# Patient Record
Sex: Female | Born: 1980 | Hispanic: No | Marital: Single | Smoking: Current every day smoker
Health system: Southern US, Community
[De-identification: ages and names within clinical notes are randomized; demographics above are authoritative.]

## PROBLEM LIST (undated history)

## (undated) ENCOUNTER — Inpatient Hospital Stay (HOSPITAL_COMMUNITY): Payer: Self-pay

## (undated) DIAGNOSIS — Z789 Other specified health status: Secondary | ICD-10-CM

## (undated) DIAGNOSIS — F909 Attention-deficit hyperactivity disorder, unspecified type: Secondary | ICD-10-CM

## (undated) HISTORY — DX: Attention-deficit hyperactivity disorder, unspecified type: F90.9

## (undated) HISTORY — PX: NO PAST SURGERIES: SHX2092

---
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
## 2002-02-03 ENCOUNTER — Emergency Department (HOSPITAL_COMMUNITY): Admission: EM | Admit: 2002-02-03 | Discharge: 2002-02-03 | Payer: Self-pay

## 2002-02-03 ENCOUNTER — Encounter: Payer: Self-pay | Admitting: Emergency Medicine

## 2002-03-03 ENCOUNTER — Other Ambulatory Visit: Admission: RE | Admit: 2002-03-03 | Discharge: 2002-03-03 | Payer: Self-pay | Admitting: Obstetrics and Gynecology

## 2003-02-06 ENCOUNTER — Encounter: Payer: Self-pay | Admitting: Emergency Medicine

## 2003-02-06 ENCOUNTER — Emergency Department (HOSPITAL_COMMUNITY): Admission: EM | Admit: 2003-02-06 | Discharge: 2003-02-06 | Payer: Self-pay | Admitting: Emergency Medicine

## 2003-03-27 ENCOUNTER — Emergency Department (HOSPITAL_COMMUNITY): Admission: EM | Admit: 2003-03-27 | Discharge: 2003-03-27 | Payer: Self-pay | Admitting: Emergency Medicine

## 2003-03-27 ENCOUNTER — Encounter: Payer: Self-pay | Admitting: Emergency Medicine

## 2003-04-19 ENCOUNTER — Emergency Department (HOSPITAL_COMMUNITY): Admission: EM | Admit: 2003-04-19 | Discharge: 2003-04-19 | Payer: Self-pay | Admitting: Emergency Medicine

## 2003-04-20 ENCOUNTER — Emergency Department (HOSPITAL_COMMUNITY): Admission: EM | Admit: 2003-04-20 | Discharge: 2003-04-20 | Payer: Self-pay | Admitting: Emergency Medicine

## 2003-04-21 ENCOUNTER — Emergency Department (HOSPITAL_COMMUNITY): Admission: EM | Admit: 2003-04-21 | Discharge: 2003-04-21 | Payer: Self-pay | Admitting: Emergency Medicine

## 2003-04-21 ENCOUNTER — Encounter: Payer: Self-pay | Admitting: Emergency Medicine

## 2004-07-14 ENCOUNTER — Emergency Department (HOSPITAL_COMMUNITY): Admission: EM | Admit: 2004-07-14 | Discharge: 2004-07-14 | Payer: Self-pay | Admitting: Emergency Medicine

## 2004-11-06 ENCOUNTER — Emergency Department (HOSPITAL_COMMUNITY): Admission: EM | Admit: 2004-11-06 | Discharge: 2004-11-06 | Payer: Self-pay | Admitting: Emergency Medicine

## 2005-07-01 ENCOUNTER — Emergency Department (HOSPITAL_COMMUNITY): Admission: EM | Admit: 2005-07-01 | Discharge: 2005-07-01 | Payer: Self-pay | Admitting: Emergency Medicine

## 2005-08-02 ENCOUNTER — Emergency Department (HOSPITAL_COMMUNITY): Admission: EM | Admit: 2005-08-02 | Discharge: 2005-08-02 | Payer: Self-pay | Admitting: Emergency Medicine

## 2005-08-19 ENCOUNTER — Emergency Department (HOSPITAL_COMMUNITY): Admission: EM | Admit: 2005-08-19 | Discharge: 2005-08-20 | Payer: Self-pay | Admitting: Emergency Medicine

## 2005-10-20 ENCOUNTER — Emergency Department (HOSPITAL_COMMUNITY): Admission: EM | Admit: 2005-10-20 | Discharge: 2005-10-20 | Payer: Self-pay | Admitting: Emergency Medicine

## 2005-11-02 ENCOUNTER — Emergency Department (HOSPITAL_COMMUNITY): Admission: EM | Admit: 2005-11-02 | Discharge: 2005-11-02 | Payer: Self-pay | Admitting: Emergency Medicine

## 2006-03-25 ENCOUNTER — Emergency Department (HOSPITAL_COMMUNITY): Admission: EM | Admit: 2006-03-25 | Discharge: 2006-03-25 | Payer: Self-pay | Admitting: Emergency Medicine

## 2007-01-15 ENCOUNTER — Emergency Department (HOSPITAL_COMMUNITY): Admission: EM | Admit: 2007-01-15 | Discharge: 2007-01-15 | Payer: Self-pay | Admitting: Emergency Medicine

## 2007-04-28 ENCOUNTER — Emergency Department (HOSPITAL_COMMUNITY): Admission: EM | Admit: 2007-04-28 | Discharge: 2007-04-28 | Payer: Self-pay | Admitting: Emergency Medicine

## 2007-08-28 ENCOUNTER — Emergency Department (HOSPITAL_COMMUNITY): Admission: EM | Admit: 2007-08-28 | Discharge: 2007-08-28 | Payer: Self-pay | Admitting: Emergency Medicine

## 2009-11-06 ENCOUNTER — Emergency Department (HOSPITAL_COMMUNITY): Admission: EM | Admit: 2009-11-06 | Discharge: 2009-11-06 | Payer: Self-pay | Admitting: Emergency Medicine

## 2010-08-14 ENCOUNTER — Emergency Department (HOSPITAL_COMMUNITY): Admission: EM | Admit: 2010-08-14 | Discharge: 2010-08-14 | Payer: Self-pay | Admitting: Emergency Medicine

## 2012-09-29 ENCOUNTER — Emergency Department (HOSPITAL_COMMUNITY)
Admission: EM | Admit: 2012-09-29 | Discharge: 2012-09-29 | Disposition: A | Discharge status: Home / Self Care | Payer: Self-pay | Attending: Emergency Medicine | Admitting: Emergency Medicine

## 2012-09-29 ENCOUNTER — Emergency Department (HOSPITAL_COMMUNITY): Payer: Self-pay

## 2012-09-29 ENCOUNTER — Encounter (HOSPITAL_COMMUNITY): Payer: Self-pay | Admitting: Emergency Medicine

## 2012-09-29 DIAGNOSIS — J3489 Other specified disorders of nose and nasal sinuses: Secondary | ICD-10-CM | POA: Insufficient documentation

## 2012-09-29 DIAGNOSIS — J40 Bronchitis, not specified as acute or chronic: Secondary | ICD-10-CM | POA: Insufficient documentation

## 2012-09-29 DIAGNOSIS — F17200 Nicotine dependence, unspecified, uncomplicated: Secondary | ICD-10-CM | POA: Insufficient documentation

## 2012-09-29 DIAGNOSIS — J029 Acute pharyngitis, unspecified: Secondary | ICD-10-CM | POA: Insufficient documentation

## 2012-09-29 LAB — RAPID STREP SCREEN (MED CTR MEBANE ONLY): Streptococcus, Group A Screen (Direct): NEGATIVE

## 2012-09-29 MED ORDER — AZITHROMYCIN 250 MG PO TABS: 250.0000 mg | ORAL_TABLET | Freq: Every day | ORAL | Status: DC

## 2012-09-29 MED ORDER — ALBUTEROL SULFATE HFA 108 (90 BASE) MCG/ACT IN AERS: 2.0000 | INHALATION_SPRAY | RESPIRATORY_TRACT | Status: DC | PRN

## 2012-09-29 NOTE — ED Notes (Signed)
Pt reports head cold/cough x 1 month, states she has been taking dayquil and nyquil without helping.

## 2012-09-29 NOTE — ED Provider Notes (Signed)
History     CSN: [id number]  Arrival date & time 09/29/12  1310   First MD Initiated Contact with Patient 09/29/12 1424      Chief Complaint  Patient presents with  . Cough  . Nasal Congestion    (Consider location/radiation/quality/duration/timing/severity/associated sxs/prior treatment) HPI Comments: This is a 31 year old female, who reports having cough and nasal congestion for 2 months. Patient states that she works at [employer] in [employer], and is constantly exposed to the cold night air. She believes that this is worsened her cold. She's been trying DayQuil, NyQuil, and Sudafed with little relief. She states that her discomfort is moderate. She reports coughing up sputum in the morning. She denies chest pain, shortness of breath, nausea, vomiting, diarrhea, constipation, numbness or tingling to extremities. She endorses cough, nasal congestion, sore throat.  The history is provided by the patient. No language interpreter was used.    History reviewed. No pertinent past medical history.  History reviewed. No pertinent past surgical history.  No family history on file.  History  Substance Use Topics  . Smoking status: Current Every Day Smoker  . Smokeless tobacco: Not on file  . Alcohol Use: Yes    OB History    Grav Para Term Preterm Abortions TAB SAB Ect Mult Living                  Review of Systems  All other systems reviewed and are negative.    Allergies  Review of patient's allergies indicates no known allergies.  Home Medications   Current Outpatient Rx  Name  Route  Sig  Dispense  Refill  . PSEUDOEPHEDRINE HCL 30 MG PO TABS   Oral   Take 30 mg by mouth every 4 (four) hours as needed. Congestion         . PSEUDOEPHEDRINE-APAP-DM 11-914-78 MG/30ML PO LIQD   Oral   Take 10 mLs by mouth as needed. Cough           BP 112/80  Pulse 73  Temp 98.2 F (36.8 C) (Oral)  Resp 22  SpO2 100%  LMP 08/20/2012  Physical Exam  Nursing note  and vitals reviewed. Constitutional: She is oriented to person, place, and time. She appears well-developed and well-nourished.  HENT:  Head: Normocephalic and atraumatic.       Red swollen nasal turbinates, oropharynx is red but without exudate.  Eyes: Conjunctivae normal and EOM are normal. Pupils are equal, round, and reactive to light.  Neck: Normal range of motion. Neck supple.  Cardiovascular: Normal rate and regular rhythm.  Exam reveals no gallop and no friction rub.   No murmur heard. Pulmonary/Chest: Effort normal. No respiratory distress. She has wheezes. She has no rales. She exhibits no tenderness.       Diffuse wheezes  Abdominal: Soft. Bowel sounds are normal. She exhibits no distension and no mass. There is no tenderness. There is no rebound and no guarding.  Musculoskeletal: Normal range of motion. She exhibits no edema and no tenderness.  Neurological: She is alert and oriented to person, place, and time.  Skin: Skin is warm and dry.  Psychiatric: She has a normal mood and affect. Her behavior is normal. Judgment and thought content normal.    ED Course  Procedures (including critical care time)   Labs Reviewed  RAPID STREP SCREEN   Results for orders placed during the hospital encounter of 09/29/12  RAPID STREP SCREEN      Component Value Range  Streptococcus, Group A Screen (Direct) NEGATIVE  NEGATIVE   Dg Chest 2 View  09/29/2012  *RADIOLOGY REPORT*  Clinical Data: Cough, wheezing  CHEST - 2 VIEW  Comparison: 11/06/2004  Findings: Cardiomediastinal silhouette is unremarkable.  No acute infiltrate or pleural effusion.  No pulmonary edema.  Bony thorax is unremarkable.  IMPRESSION: No active disease.   Original Report Authenticated By: Natasha Mead, M.D.        1. Bronchitis       MDM  This is a 31 year old female with cold perhaps bronchitis. Patient has been sick for over a month. I'm going to give the patient an inhaler and Z-Pak based on her pulmonary  exam. Patient is agreeable with this. Patient is stable and ready for discharge.        Roxy Horseman, PA-C 09/29/12 1537

## 2012-09-29 NOTE — ED Notes (Signed)
Patient given discharge instructions, information, prescriptions, and diet order. Patient states that they adequately understand discharge information given and to return to ED if symptoms return or worsen.     

## 2012-10-01 NOTE — ED Provider Notes (Signed)
Medical screening examination/treatment/procedure(s) were performed by non-physician practitioner and as supervising physician I was immediately available for consultation/collaboration.    Nelia Shi, MD 10/01/12 [phone]

## 2012-10-06 ENCOUNTER — Encounter (HOSPITAL_COMMUNITY): Payer: Self-pay | Admitting: *Deleted

## 2012-10-06 ENCOUNTER — Emergency Department (HOSPITAL_COMMUNITY): Payer: Self-pay

## 2012-10-06 ENCOUNTER — Emergency Department (HOSPITAL_COMMUNITY)
Admission: EM | Admit: 2012-10-06 | Discharge: 2012-10-06 | Disposition: A | Discharge status: Home / Self Care | Payer: Self-pay | Attending: Emergency Medicine | Admitting: Emergency Medicine

## 2012-10-06 ENCOUNTER — Other Ambulatory Visit: Payer: Self-pay

## 2012-10-06 DIAGNOSIS — J40 Bronchitis, not specified as acute or chronic: Secondary | ICD-10-CM | POA: Insufficient documentation

## 2012-10-06 DIAGNOSIS — Z79899 Other long term (current) drug therapy: Secondary | ICD-10-CM | POA: Insufficient documentation

## 2012-10-06 DIAGNOSIS — F17200 Nicotine dependence, unspecified, uncomplicated: Secondary | ICD-10-CM | POA: Insufficient documentation

## 2012-10-06 DIAGNOSIS — F410 Panic disorder [episodic paroxysmal anxiety] without agoraphobia: Secondary | ICD-10-CM

## 2012-10-06 DIAGNOSIS — F411 Generalized anxiety disorder: Secondary | ICD-10-CM | POA: Insufficient documentation

## 2012-10-06 DIAGNOSIS — R0789 Other chest pain: Secondary | ICD-10-CM | POA: Insufficient documentation

## 2012-10-06 MED ORDER — ALBUTEROL SULFATE HFA 108 (90 BASE) MCG/ACT IN AERS
2.0000 | INHALATION_SPRAY | RESPIRATORY_TRACT | Status: DC | PRN
  Administered 2012-10-06: 2 via RESPIRATORY_TRACT
  Filled 2012-10-06: qty 6.7

## 2012-10-06 NOTE — ED Provider Notes (Signed)
History     CSN: [id number]  Arrival date & time 10/06/12  0200   First MD Initiated Contact with Patient 10/06/12 [phone]      Chief Complaint  Patient presents with  . breathing difficulties     (Consider location/radiation/quality/duration/timing/severity/associated sxs/prior treatment) HPI History provided by pt.  Poor historian because agitated and intoxicated.  Reports that she developed constant, non-radiating right-sided CP, described as her chest being ripped out, with associated dyspnea approx 45 minutes ago while getting into bed.  Believes she was having a panic attack; family history.  No associated palpitations, tremors, fever, abdominal pain.  Has been coughing for greater than one week.  Per prior chart, diagnosed w/ bronchitis on 09/29/12.  No h/o anxiety.  Nursing staff reports that patient was hyperventilating when she arrived in ED.   No past medical history on file.  No past surgical history on file.  No family history on file.  History  Substance Use Topics  . Smoking status: Current Every Day Smoker  . Smokeless tobacco: Not on file  . Alcohol Use: Yes    OB History    Grav Para Term Preterm Abortions TAB SAB Ect Mult Living                  Review of Systems  All other systems reviewed and are negative.    Allergies  Review of patient's allergies indicates no known allergies.  Home Medications   Current Outpatient Rx  Name  Route  Sig  Dispense  Refill  . ALBUTEROL SULFATE HFA 108 (90 BASE) MCG/ACT IN AERS   Inhalation   Inhale 2 puffs into the lungs every 4 (four) hours as needed. For wheezing or shortness of breath         . AZITHROMYCIN 250 MG PO TABS   Oral   Take 250-500 mg by mouth daily. 500mg  PO day 1, then 250mg  PO days 205           BP 113/93  Pulse 73  Temp 98 F (36.7 C) (Oral)  Resp 18  SpO2 97%  LMP 08/20/2012  Physical Exam  Nursing note and vitals reviewed. Constitutional: She is oriented to person, place,  and time. She appears well-developed and well-nourished. No distress.  HENT:  Head: Normocephalic and atraumatic.  Eyes:       Normal appearance  Neck: Normal range of motion.  Cardiovascular: Normal rate and regular rhythm.   Pulmonary/Chest: Effort normal and breath sounds normal. No respiratory distress. She has no wheezes. She exhibits tenderness.       Nml respiratory rate  Coughing.   Musculoskeletal: Normal range of motion.  Neurological: She is alert and oriented to person, place, and time.  Skin: Skin is warm and dry. No rash noted.  Psychiatric:       Agitated and argumentative    ED Course  Procedures (including critical care time)   Date: 10/06/2012  Rate: 78  Rhythm: normal sinus rhythm  QRS Axis: normal  Intervals: normal  ST/T Wave abnormalities: normal  Conduction Disutrbances:none  Narrative Interpretation:   Old EKG Reviewed: none available   Labs Reviewed - No data to display Dg Chest 2 View  10/06/2012  *RADIOLOGY REPORT*  Clinical Data: Shortness of breath, right-sided chest pain  CHEST - 2 VIEW  Comparison: 09/29/2012  Findings: Lungs are clear. No pleural effusion or pneumothorax. The cardiomediastinal contours are within normal limits. The visualized bones and soft tissues are without significant appreciable abnormality.  IMPRESSION: No radiographic evidence of acute cardiopulmonary process.   Original Report Authenticated By: Jearld Lesch, M.D.      1. Anxiety attack   2. Bronchitis       MDM  31yo healthy F, recently diagnosed w/ bronchitis, presents w/ c/o CP and SOB that started while getting into bed this morning.  SOB has resolved spontaneously but CP has persisted. Was tachypneic upon arrival but now w/ nml respiratory rate, nml breath sounds, coughing.  She is intoxicated and agitated.   EKG non-ischemic and sinus rhythm and CXR neg.  Results discussed w/ pt.  Suspect panic attack.  VS stable.  Received albuterol inhaler for cough and  d/c'd home.  Return precautions discussed.        Otilio Miu, [place] 10/06/12 [phone]

## 2012-10-06 NOTE — ED Notes (Signed)
Patient now resting, smiling, answering questions appropriately. She says that she was going to bed tonight and she felt like she could not breath. She says that her chest also hurts. Pt denies hx of anxiety prior.

## 2012-10-06 NOTE — ED Provider Notes (Signed)
Medical screening examination/treatment/procedure(s) were performed by non-physician practitioner and as supervising physician I was immediately available for consultation/collaboration.    Chaston Bradburn D Sharmane Dame, MD 10/06/12 0658 

## 2012-10-06 NOTE — ED Notes (Signed)
Patient transported to X-ray 

## 2012-10-06 NOTE — ED Notes (Signed)
Per EMS. Patient sister called 911 because patient stated "I can't breathe" Patient 02 sat 100% on room air. EMS states that patient goes through hyperventilation and calm normal breathing cycles. Lungs CTA. Patient was recently treated here for bronchitis.

## 2012-10-14 ENCOUNTER — Emergency Department (HOSPITAL_COMMUNITY)
Admission: EM | Admit: 2012-10-14 | Discharge: 2012-10-14 | Disposition: A | Discharge status: Home / Self Care | Payer: Self-pay | Attending: Emergency Medicine | Admitting: Emergency Medicine

## 2012-10-14 ENCOUNTER — Encounter (HOSPITAL_COMMUNITY): Payer: Self-pay | Admitting: *Deleted

## 2012-10-14 DIAGNOSIS — R109 Unspecified abdominal pain: Secondary | ICD-10-CM | POA: Insufficient documentation

## 2012-10-14 DIAGNOSIS — N939 Abnormal uterine and vaginal bleeding, unspecified: Secondary | ICD-10-CM | POA: Insufficient documentation

## 2012-10-14 DIAGNOSIS — N390 Urinary tract infection, site not specified: Secondary | ICD-10-CM | POA: Insufficient documentation

## 2012-10-14 DIAGNOSIS — F17200 Nicotine dependence, unspecified, uncomplicated: Secondary | ICD-10-CM | POA: Insufficient documentation

## 2012-10-14 DIAGNOSIS — R319 Hematuria, unspecified: Secondary | ICD-10-CM | POA: Insufficient documentation

## 2012-10-14 DIAGNOSIS — N926 Irregular menstruation, unspecified: Secondary | ICD-10-CM | POA: Insufficient documentation

## 2012-10-14 DIAGNOSIS — Z3202 Encounter for pregnancy test, result negative: Secondary | ICD-10-CM | POA: Insufficient documentation

## 2012-10-14 DIAGNOSIS — R112 Nausea with vomiting, unspecified: Secondary | ICD-10-CM | POA: Insufficient documentation

## 2012-10-14 LAB — URINALYSIS, ROUTINE W REFLEX MICROSCOPIC
Glucose, UA: NEGATIVE mg/dL
Specific Gravity, Urine: 1.045 — ABNORMAL HIGH (ref 1.005–1.030)
pH: 5.5 (ref 5.0–8.0)

## 2012-10-14 LAB — CBC WITH DIFFERENTIAL/PLATELET
Basophils Relative: 0 % (ref 0–1)
HCT: 41.4 % (ref 36.0–46.0)
Hemoglobin: 14.4 g/dL (ref 12.0–15.0)
Lymphs Abs: 1.1 10*3/uL (ref 0.7–4.0)
MCH: 28.7 pg (ref 26.0–34.0)
MCHC: 34.8 g/dL (ref 30.0–36.0)
Monocytes Absolute: 0.8 10*3/uL (ref 0.1–1.0)
Monocytes Relative: 8 % (ref 3–12)
Neutro Abs: 7.7 10*3/uL (ref 1.7–7.7)
RBC: 5.02 MIL/uL (ref 3.87–5.11)

## 2012-10-14 LAB — BASIC METABOLIC PANEL
BUN: 8 mg/dL (ref 6–23)
CO2: 24 mEq/L (ref 19–32)
Chloride: 98 mEq/L (ref 96–112)
Creatinine, Ser: 0.69 mg/dL (ref 0.50–1.10)
GFR calc Af Amer: >90 mL/min (ref >90)
Glucose, Bld: 85 mg/dL (ref 70–99)

## 2012-10-14 LAB — URINE MICROSCOPIC-ADD ON

## 2012-10-14 LAB — LIPASE, BLOOD: Lipase: 19 U/L (ref 11–59)

## 2012-10-14 MED ORDER — FAMOTIDINE 20 MG PO TABS: 20.0000 mg | ORAL_TABLET | Freq: Two times a day (BID) | ORAL | Status: DC

## 2012-10-14 MED ORDER — FAMOTIDINE IN NACL 20-0.9 MG/50ML-% IV SOLN: 20.0000 mg | Freq: Once | INTRAVENOUS | Status: DC

## 2012-10-14 MED ORDER — SULFAMETHOXAZOLE-TMP DS 800-160 MG PO TABS
1.0000 | ORAL_TABLET | Freq: Once | ORAL | Status: AC
  Administered 2012-10-14: 1 via ORAL
  Filled 2012-10-14: qty 1

## 2012-10-14 MED ORDER — GI COCKTAIL ~~LOC~~
30.0000 mL | Freq: Once | ORAL | Status: AC
  Administered 2012-10-14: 30 mL via ORAL
  Filled 2012-10-14: qty 30

## 2012-10-14 MED ORDER — SULFAMETHOXAZOLE-TRIMETHOPRIM 800-160 MG PO TABS: 1.0000 | ORAL_TABLET | Freq: Two times a day (BID) | ORAL | Status: DC

## 2012-10-14 MED ORDER — SODIUM CHLORIDE 0.9 % IV BOLUS (SEPSIS): 1000.0000 mL | Freq: Once | INTRAVENOUS | Status: DC

## 2012-10-14 MED ORDER — FAMOTIDINE 20 MG PO TABS: 20.0000 mg | ORAL_TABLET | Freq: Once | ORAL | Status: DC

## 2012-10-14 NOTE — ED Provider Notes (Signed)
History     CSN: [id number]  Arrival date & time 10/14/12  1327   First MD Initiated Contact with Patient 10/14/12 1349      No chief complaint on file.   (Consider location/radiation/quality/duration/timing/severity/associated sxs/prior treatment) HPI The patient presents with concerns of abdominal pain.  She states that this pain began yesterday.  Since onset there is been pain about her upper abdomen, nonradiating, sore, worse with motion.  No attempt at relief with OTC medication thus far.  She denies concurrent fever, does endorse nausea with emesis yesterday.  She denies any lower abdominal pain, vaginal discharge.  She has had mild hematuria. Notably, the patient had a Depo shot one week ago.  This was her first shot in 1 year.  The patient's last menstrual period was one week ago.  History reviewed. No pertinent past medical history.  History reviewed. No pertinent past surgical history.  No family history on file.  History  Substance Use Topics  . Smoking status: Current Every Day Smoker  . Smokeless tobacco: Not on file  . Alcohol Use: Yes    OB History    Grav Para Term Preterm Abortions TAB SAB Ect Mult Living                  Review of Systems  Constitutional:       Per HPI, otherwise negative  HENT:       Per HPI, otherwise negative  Eyes: Negative.   Respiratory:       Per HPI, otherwise negative  Cardiovascular:       Per HPI, otherwise negative  Gastrointestinal: Positive for nausea and vomiting.  Genitourinary: Positive for hematuria and menstrual problem. Negative for dysuria, urgency, frequency, flank pain, decreased urine volume, vaginal discharge, difficulty urinating, genital sores, vaginal pain and pelvic pain.  Musculoskeletal:       Per HPI, otherwise negative  Skin: Negative.   Neurological: Negative for syncope.    Allergies  Review of patient's allergies indicates no known allergies.  Home Medications   Current Outpatient Rx    Name  Route  Sig  Dispense  Refill  . ALBUTEROL SULFATE HFA 108 (90 BASE) MCG/ACT IN AERS   Inhalation   Inhale 2 puffs into the lungs every 4 (four) hours as needed. For wheezing or shortness of breath         . ESTRADIOL CYPIONATE 5 MG/ML IM OIL   Intramuscular   Inject 1.5 mg into the muscle every 28 (twenty-eight) days. Last dose on 10-12-12         . IBUPROFEN 200 MG PO TABS   Oral   Take 600 mg by mouth every 6 (six) hours as needed. Pain         . NAPROXEN SODIUM 220 MG PO TABS   Oral   Take 440 mg by mouth 2 (two) times daily with a meal.           BP 115/76  Pulse 99  Temp 98.1 F (36.7 C) (Oral)  Resp 18  SpO2 100%  LMP 10/14/2012  Physical Exam  Nursing note and vitals reviewed. Constitutional: She is oriented to person, place, and time. She appears well-developed and well-nourished. No distress.  HENT:  Head: Normocephalic and atraumatic.  Eyes: Conjunctivae normal and EOM are normal.  Cardiovascular: Normal rate and regular rhythm.   Pulmonary/Chest: Effort normal and breath sounds normal. No stridor. No respiratory distress.  Abdominal: She exhibits no distension.  No lower abdominal tenderness to palpation, no rebound, no guarding.  There is mild tenderness to palpation about the epigastrium.  Musculoskeletal: She exhibits no edema.  Neurological: She is alert and oriented to person, place, and time. No cranial nerve deficit.  Skin: Skin is warm and dry.  Psychiatric: She has a normal mood and affect.    ED Course  Procedures (including critical care time)  Labs Reviewed  CBC WITH DIFFERENTIAL - Abnormal; Notable for the following:    Neutrophils Relative 80 (*)     Lymphocytes Relative 11 (*)     All other components within normal limits  BASIC METABOLIC PANEL - Abnormal; Notable for the following:    Potassium 3.2 (*)     All other components within normal limits  URINALYSIS, ROUTINE W REFLEX MICROSCOPIC - Abnormal; Notable for the  following:    Color, Urine RED (*)  BIOCHEMICALS MAY BE AFFECTED BY COLOR   APPearance TURBID (*)     Specific Gravity, Urine 1.045 (*)     Hgb urine dipstick LARGE (*)     Bilirubin Urine MODERATE (*)     Ketones, ur TRACE (*)     Protein, ur 100 (*)     Nitrite POSITIVE (*)     Leukocytes, UA MODERATE (*)     All other components within normal limits  URINE MICROSCOPIC-ADD ON - Abnormal; Notable for the following:    Squamous Epithelial / LPF FEW (*)     Bacteria, UA FEW (*)     All other components within normal limits  POCT PREGNANCY, URINE  LIPASE, BLOOD  URINE CULTURE   No results found.   No diagnosis found.    MDM  This generally well-appearing young female presents with new upper abdominal pain, as well as hematuria.  On exam she is afebrile, and in no distress.  The patient is not pregnant, and given the absence of pain with palpation of her abdomen there is low suspicion for PID, or other acute reproductive pathology.  Given the description of pain, and her urinary tract infection findings, she was discharged in stable condition with both antibiotics, Pepcid, PMD followup.      Gerhard Munch, MD 10/14/12 [phone]

## 2012-10-14 NOTE — ED Notes (Signed)
Pt states she went for her yearly pap smear and check up, got the "new" depo shot in her L leg, states use to get the depo shot x 15 years, stopped last October then started again this past Thursday, pt states the day after getting the shot started having severe sharp abdominal pains, also states she started her menstrual cycle this morning when she just ended it last Monday, pt states she also had night sweats and last time had a kidney infection w/ the night sweats, denies pain w/ urination or freq, states "my urine is really dark". Had nausea/vomiting last night.

## 2012-10-16 LAB — URINE CULTURE

## 2013-02-27 ENCOUNTER — Emergency Department (HOSPITAL_COMMUNITY)
Admission: EM | Admit: 2013-02-27 | Discharge: 2013-02-27 | Disposition: A | Discharge status: Home / Self Care | Payer: Self-pay | Attending: Emergency Medicine | Admitting: Emergency Medicine

## 2013-02-27 ENCOUNTER — Encounter (HOSPITAL_COMMUNITY): Payer: Self-pay | Admitting: Emergency Medicine

## 2013-02-27 DIAGNOSIS — M5380 Other specified dorsopathies, site unspecified: Secondary | ICD-10-CM | POA: Insufficient documentation

## 2013-02-27 DIAGNOSIS — Z79899 Other long term (current) drug therapy: Secondary | ICD-10-CM | POA: Insufficient documentation

## 2013-02-27 DIAGNOSIS — F17200 Nicotine dependence, unspecified, uncomplicated: Secondary | ICD-10-CM | POA: Insufficient documentation

## 2013-02-27 DIAGNOSIS — M62830 Muscle spasm of back: Secondary | ICD-10-CM

## 2013-02-27 MED ORDER — DIAZEPAM 5 MG PO TABS: 5.0000 mg | ORAL_TABLET | Freq: Two times a day (BID) | ORAL | Status: DC

## 2013-02-27 MED ORDER — OXYCODONE-ACETAMINOPHEN 5-325 MG PO TABS
2.0000 | ORAL_TABLET | Freq: Once | ORAL | Status: AC
  Administered 2013-02-27: 2 via ORAL
  Filled 2013-02-27: qty 2

## 2013-02-27 MED ORDER — HYDROCODONE-ACETAMINOPHEN 5-325 MG PO TABS: ORAL_TABLET | ORAL | Status: DC

## 2013-02-27 NOTE — ED Provider Notes (Signed)
Medical screening examination/treatment/procedure(s) were performed by non-physician practitioner and as supervising physician I was immediately available for consultation/collaboration.  Micheale Schlack L Deyvi Bonanno, MD 02/27/13 1619 

## 2013-02-27 NOTE — ED Provider Notes (Signed)
History     CSN: [id number]  Arrival date & time 02/27/13  1310   First MD Initiated Contact with Patient 02/27/13 1417      Chief Complaint  Patient presents with  . Back Pain    (Consider location/radiation/quality/duration/timing/severity/associated sxs/prior treatment) HPI Comments: 32 y.o. Female complaining of lower back pain that started suddenly today when she got out of bed after "having lazy days" for Monday and Tuesday, laying in bed most of the day. Described pain as feeling "muscles twitching" on the right side of her lumbar spine. New pain. Does not radiate. Pt denies trauma, dysuria, hematuria, night sweats, bladder/bowel incontinence, n/v/c/d. Normal bowel movement today. Pt states pain abates when she "lays a certain way." Hurts more to sit than to stand. No interventions.   Patient is a 32 y.o. female presenting with back pain.  Back Pain Associated symptoms: no abdominal pain, no chest pain, no dysuria, no fever, no headaches, no numbness, no pelvic pain and no weakness     History reviewed. No pertinent past medical history.  History reviewed. No pertinent past surgical history.  History reviewed. No pertinent family history.  History  Substance Use Topics  . Smoking status: Current Every Day Smoker  . Smokeless tobacco: Not on file  . Alcohol Use: Yes    OB History   Grav Para Term Preterm Abortions TAB SAB Ect Mult Living                  Review of Systems  Constitutional: Negative for fever, chills and diaphoresis.  HENT: Negative for neck pain and neck stiffness.   Respiratory: Negative for shortness of breath.   Cardiovascular: Negative for chest pain.  Gastrointestinal: Negative for nausea, vomiting, abdominal pain, diarrhea and constipation.  Genitourinary: Negative for dysuria, hematuria, flank pain, vaginal bleeding, vaginal discharge and pelvic pain.  Musculoskeletal: Positive for back pain. Negative for gait problem.  Neurological:  Negative for dizziness, weakness, light-headedness, numbness and headaches.    Allergies  Review of patient's allergies indicates no known allergies.  Home Medications   Current Outpatient Rx  Name  Route  Sig  Dispense  Refill  . albuterol (PROVENTIL HFA;VENTOLIN HFA) 108 (90 BASE) MCG/ACT inhaler   Inhalation   Inhale 2 puffs into the lungs every 4 (four) hours as needed. For wheezing or shortness of breath         . estradiol cypionate (DEPO-ESTRADIOL) 5 MG/ML injection   Intramuscular   Inject 1.5 mg into the muscle every 28 (twenty-eight) days. Last dose on 10-12-12         . ibuprofen (ADVIL,MOTRIN) 200 MG tablet   Oral   Take 600 mg by mouth every 6 (six) hours as needed. Pain           BP 132/88  Pulse 94  Temp(Src) 98.7 F (37.1 C) (Oral)  Ht 5\' 6"  (1.676 m)  Wt 125 lb (56.7 kg)  BMI 20.19 kg/m2  SpO2 100%  Physical Exam  Nursing note and vitals reviewed. Constitutional: She is oriented to person, place, and time. She appears well-developed and well-nourished. No distress.  HENT:  Head: Normocephalic and atraumatic.  Eyes: Conjunctivae and EOM are normal.  Neck: Normal range of motion. Neck supple.  No meningeal signs  Cardiovascular: Normal rate.   Pulmonary/Chest: Effort normal.  Abdominal: Soft. She exhibits no distension. There is no tenderness.  Genitourinary:  No CVA tenderness  Musculoskeletal: Normal range of motion. She exhibits no edema and no tenderness.  Lumbar back: She exhibits tenderness, pain and spasm. She exhibits normal range of motion, no bony tenderness, no swelling, no edema and no deformity.       Back:  No step-offs noted on C-spine Full range of motion of the T-spine and L-spine No tenderness to palpation of the spinous processes of the C-spine, T-spine or L-spine Mild tenderness to palpation of the paraspinous muscles on right side of lumbar spine Normal strength in upper and lower extremities bilaterally including  dorsiflexion and plantar flexion, strong and equal grip strength Moves extremities without ataxia, coordination intact Normal gait and balance  Neurological: She is alert and oriented to person, place, and time. No cranial nerve deficit.  Sensation normal to light and sharp touch  Skin: Skin is warm and dry. She is not diaphoretic. No erythema.  Psychiatric: She has a normal mood and affect.    ED Course  Procedures (including critical care time) Medications  oxyCODONE-acetaminophen (PERCOCET/ROXICET) 5-325 MG per tablet 2 tablet (2 tablets Oral Given 02/27/13 1454)   Discharge Medication List as of 02/27/2013  2:58 PM    START taking these medications   Details  diazepam (VALIUM) 5 MG tablet Take 1 tablet (5 mg total) by mouth 2 (two) times daily., Starting 02/27/2013, Until Discontinued, Print    HYDROcodone-acetaminophen (NORCO/VICODIN) 5-325 MG per tablet Take 1-2 tablets every 4-6 as needed for pain., Print        Labs Reviewed - No data to display No results found.   1. Muscle spasm of back       MDM  Patient with back pain.  No neurological deficits and normal neuro exam.  Patient can walk but states is painful.  No loss of bowel or bladder control.  No concern for cauda equina.  No fever, night sweats, weight loss, h/o cancer, IVDU.  RICE protocol and pain medicine indicated and discussed with patient. Also provided resource guide and information for Affordable Care Act as pt is without PCP or insurance.      Glade Nurse, PA-C 02/27/13 1529  Glade Nurse, PA-C 02/27/13 [phone]

## 2013-02-27 NOTE — ED Notes (Signed)
Pt states Tuesday after laying down she began having right lower back pain.

## 2016-12-08 ENCOUNTER — Encounter (HOSPITAL_COMMUNITY): Payer: Self-pay

## 2016-12-08 ENCOUNTER — Emergency Department (HOSPITAL_COMMUNITY): Payer: Self-pay

## 2016-12-08 ENCOUNTER — Emergency Department (HOSPITAL_COMMUNITY)
Admission: EM | Admit: 2016-12-08 | Discharge: 2016-12-08 | Disposition: A | Discharge status: Home / Self Care | Payer: Self-pay | Attending: Emergency Medicine | Admitting: Emergency Medicine

## 2016-12-08 DIAGNOSIS — Y929 Unspecified place or not applicable: Secondary | ICD-10-CM | POA: Insufficient documentation

## 2016-12-08 DIAGNOSIS — Y939 Activity, unspecified: Secondary | ICD-10-CM | POA: Insufficient documentation

## 2016-12-08 DIAGNOSIS — Y999 Unspecified external cause status: Secondary | ICD-10-CM | POA: Insufficient documentation

## 2016-12-08 DIAGNOSIS — Z5321 Procedure and treatment not carried out due to patient leaving prior to being seen by health care provider: Secondary | ICD-10-CM | POA: Insufficient documentation

## 2016-12-08 DIAGNOSIS — W19XXXA Unspecified fall, initial encounter: Secondary | ICD-10-CM | POA: Insufficient documentation

## 2016-12-08 DIAGNOSIS — S299XXA Unspecified injury of thorax, initial encounter: Secondary | ICD-10-CM | POA: Insufficient documentation

## 2016-12-08 NOTE — ED Notes (Signed)
Bed: WTR8 Expected date:  Expected time:  Means of arrival:  Comments: 

## 2016-12-08 NOTE — ED Notes (Signed)
Called for rm x 3 no response

## 2016-12-08 NOTE — ED Triage Notes (Addendum)
Pt fell last night.  Landed on left side. Pt worried she has fractured her rib.  Breathing wnl.  No head trauma or other complaints.  Slipped on ice

## 2016-12-08 NOTE — ED Notes (Signed)
Pt called for room x 1; no response  

## 2017-04-05 ENCOUNTER — Emergency Department (HOSPITAL_COMMUNITY)
Admission: EM | Admit: 2017-04-05 | Discharge: 2017-04-05 | Disposition: A | Discharge status: Home / Self Care | Payer: Self-pay | Attending: Emergency Medicine | Admitting: Emergency Medicine

## 2017-04-05 ENCOUNTER — Encounter (HOSPITAL_COMMUNITY): Payer: Self-pay | Admitting: *Deleted

## 2017-04-05 DIAGNOSIS — R55 Syncope and collapse: Secondary | ICD-10-CM | POA: Insufficient documentation

## 2017-04-05 DIAGNOSIS — F17200 Nicotine dependence, unspecified, uncomplicated: Secondary | ICD-10-CM | POA: Insufficient documentation

## 2017-04-05 LAB — BASIC METABOLIC PANEL
ANION GAP: 9 (ref 5–15)
BUN: 6 mg/dL (ref 6–20)
CALCIUM: 8.7 mg/dL — AB (ref 8.9–10.3)
CO2: 22 mmol/L (ref 22–32)
Chloride: 107 mmol/L (ref 101–111)
Creatinine, Ser: 0.71 mg/dL (ref 0.44–1.00)
GLUCOSE: 94 mg/dL (ref 65–99)
Potassium: 3.8 mmol/L (ref 3.5–5.1)
Sodium: 138 mmol/L (ref 135–145)

## 2017-04-05 LAB — CBC WITH DIFFERENTIAL/PLATELET
BASOS ABS: 0 10*3/uL (ref 0.0–0.1)
BASOS PCT: 0 %
Eosinophils Absolute: 0.1 10*3/uL (ref 0.0–0.7)
Eosinophils Relative: 1 %
HEMATOCRIT: 42.6 % (ref 36.0–46.0)
Hemoglobin: 14.1 g/dL (ref 12.0–15.0)
Lymphocytes Relative: 22 %
Lymphs Abs: 1.6 10*3/uL (ref 0.7–4.0)
MCH: 28.4 pg (ref 26.0–34.0)
MCHC: 33.1 g/dL (ref 30.0–36.0)
MCV: 85.7 fL (ref 78.0–100.0)
MONO ABS: 0.4 10*3/uL (ref 0.1–1.0)
Monocytes Relative: 6 %
NEUTROS ABS: 5.1 10*3/uL (ref 1.7–7.7)
Neutrophils Relative %: 71 %
PLATELETS: 210 10*3/uL (ref 150–400)
RBC: 4.97 MIL/uL (ref 3.87–5.11)
RDW: 13.9 % (ref 11.5–15.5)
WBC: 7.3 10*3/uL (ref 4.0–10.5)

## 2017-04-05 LAB — I-STAT BETA HCG BLOOD, ED (MC, WL, AP ONLY): I-stat hCG, quantitative: <5 m[IU]/mL (ref <5)

## 2017-04-05 NOTE — ED Triage Notes (Signed)
Patient is alert and oriented x4.  She is being seen for a near syncopal episode at work.  By standers caught her before she feel.  Patient had no LOC.  Denies pain.  No Hx of this issue before.

## 2017-04-05 NOTE — ED Notes (Signed)
Patient is alert and oriented x3.  She was given DC instructions and follow up visit instructions.  Patient gave verbal understanding. She was DC ambulatory under her own power to home.  V/S stable.  He was not showing any signs of distress on DC 

## 2017-04-05 NOTE — ED Provider Notes (Signed)
[facility]-EMERGENCY DEPT Provider Note   CSN: [id number] Arrival date & time: 04/05/17  [id number]     History   Chief Complaint Chief Complaint  Patient presents with  . Near Syncope    HPI Monique Hoffman is a 36 y.o. female.  HPI   36 year old female presenting for evaluations of near syncope. Patient states while at work today, she felt hot and nearly passed out. This was witnessed by one of her coworker who was able to guide her down to the floor. She did not completely pass out but report feeling hot before hand and reported feeling dizzy with room spinning sensation. Patient felt symptoms may have lasted for about 5-10 minutes and now has completely resolved. She did not eat her breakfast this morning. She admits to drinking alcohol heavily on a daily basis, last alcohol use was yesterday, normally drinks about a sixpack a day. She is also a 1-pack-year smoker. She denies any abnormal bleeding, unable to recall her last menstrual period and states that it was irregular. She does have a family history of cardiac disease in which her father passed away at the age of 34 due to the massive heart attack. She denies any history of exertional syncope. She denies any recent medication changes. Currently she denies having any pain specifically no headache or chest pain. She is here accompanied by her boss.    History reviewed. No pertinent past medical history.  There are no active problems to display for this patient.   History reviewed. No pertinent surgical history.  OB History    No data available       Home Medications    Prior to Admission medications   Medication Sig Start Date End Date Taking? Authorizing Provider  ibuprofen (ADVIL,MOTRIN) 200 MG tablet Take 400 mg by mouth every 6 (six) hours as needed for mild pain. Pain    Yes [provider]  albuterol (PROVENTIL HFA;VENTOLIN HFA) 108 (90 BASE) MCG/ACT inhaler Inhale 2 puffs into the lungs every 4 (four) hours as  needed. For wheezing or shortness of breath 09/29/12   Roxy HorsemanBrowning, Robert, PA-C  diazepam (VALIUM) 5 MG tablet Take 1 tablet (5 mg total) by mouth 2 (two) times daily. 02/27/13   Lowell BoutonBeck, Barbara A, PA-C  estradiol cypionate (DEPO-ESTRADIOL) 5 MG/ML injection Inject 1.5 mg into the muscle every 28 (twenty-eight) days. Last dose on 10-12-12    [provider]  HYDROcodone-acetaminophen (NORCO/VICODIN) 5-325 MG per tablet Take 1-2 tablets every 4-6 as needed for pain. 02/27/13   Lowell BoutonBeck, Barbara A, PA-C    Family History No family history on file.  Social History Social History  Substance Use Topics  . Smoking status: Current Every Day Smoker  . Smokeless tobacco: Never Used  . Alcohol use Yes     Allergies   Patient has no known allergies.   Review of Systems Review of Systems  All other systems reviewed and are negative.    Physical Exam Updated Vital Signs BP 129/79   Resp 12   Physical Exam  Constitutional: She is oriented to person, place, and time. She appears well-developed and well-nourished. No distress.  Patient is well-appearing and resting in bed in no acute discomfort.  HENT:  Head: Atraumatic.  Mouth/Throat: Oropharynx is clear and moist.  TMs are normal bilaterally  Eyes: Conjunctivae and EOM are normal. Pupils are equal, round, and reactive to light.  Neck: Neck supple.  No nuchal rigidity  Cardiovascular: Normal rate and regular rhythm.  No murmur heard. Pulmonary/Chest: Effort normal and breath sounds normal.  Abdominal: Soft. She exhibits no distension.  Musculoskeletal: Normal range of motion.  Neurological: She is alert and oriented to person, place, and time. She has normal strength. No cranial nerve deficit or sensory deficit. GCS eye subscore is 4. GCS verbal subscore is 5. GCS motor subscore is 6.  Skin: No rash noted.  Psychiatric: She has a normal mood and affect.  Nursing note and vitals reviewed.    ED Treatments / Results  Labs (all  labs ordered are listed, but only abnormal results are displayed) Labs Reviewed  BASIC METABOLIC PANEL - Abnormal; Notable for the following:       Result Value   Calcium 8.7 (*)    All other components within normal limits  CBC WITH DIFFERENTIAL/PLATELET  CBG MONITORING, ED  I-STAT BETA HCG BLOOD, ED ([facility], [facility], [facility] ONLY)    EKG  EKG Interpretation None      Date: 04/05/2017  Rate: 62  Rhythm: normal sinus rhythm  QRS Axis: normal  Intervals: short PR interval  ST/T Wave abnormalities: normal  Conduction Disutrbances: none  Narrative Interpretation:   Old EKG Reviewed: No significant changes noted     Radiology No results found.  Procedures Procedures (including critical care time)  Medications Ordered in ED Medications - No data to display   Initial Impression / Assessment and Plan / ED Course  I have reviewed the triage vital signs and the nursing notes.  Pertinent labs & imaging results that were available during my care of the patient were reviewed by me and considered in my medical decision making (see chart for details).     BP 129/79   Resp 12  10:47 Orthostatic Vital Signs MM  Orthostatic Lying   BP- Lying: 113/79  Pulse- Lying: 59      Orthostatic Sitting  BP- Sitting: 130/80  Pulse- Sitting: 65      Orthostatic Standing at 0 minutes  BP- Standing at 0 minutes: 138/87  Pulse- Standing at 0 minutes: 65     Final Clinical Impressions(s) / ED Diagnoses   Final diagnoses:  None    New Prescriptions New Prescriptions   No medications on file   10:29 AM Patient here for evaluation of near syncope. She is well-appearing, vital signs stable. Workup initiated.  11:37 AM EKG without concerning arrhythmia, no anemia, normal electrolytes, vital signs stable, normal orthostatic  vital sign. Her pregnancy test is negative, normal H&H. At this time no acute emergent medical condition identified. Patient requests to be discharged. She is stable for  discharge. Recommend outpt follow-up for further management. Return precaution discussed.    Fayrene Helper, PA-C 04/05/17 1144    Long, Arlyss Repress, MD 04/05/17 [phone]

## 2017-04-23 ENCOUNTER — Encounter (HOSPITAL_COMMUNITY): Payer: Self-pay | Admitting: *Deleted

## 2017-04-23 ENCOUNTER — Emergency Department (HOSPITAL_COMMUNITY)
Admission: EM | Admit: 2017-04-23 | Discharge: 2017-04-24 | Disposition: A | Discharge status: Home / Self Care | Payer: Self-pay | Attending: Emergency Medicine | Admitting: Emergency Medicine

## 2017-04-23 DIAGNOSIS — L237 Allergic contact dermatitis due to plants, except food: Secondary | ICD-10-CM | POA: Insufficient documentation

## 2017-04-23 DIAGNOSIS — L255 Unspecified contact dermatitis due to plants, except food: Secondary | ICD-10-CM

## 2017-04-23 DIAGNOSIS — Z79899 Other long term (current) drug therapy: Secondary | ICD-10-CM | POA: Insufficient documentation

## 2017-04-23 DIAGNOSIS — F17200 Nicotine dependence, unspecified, uncomplicated: Secondary | ICD-10-CM | POA: Insufficient documentation

## 2017-04-23 MED ORDER — PREDNISONE 10 MG (21) PO TBPK: ORAL_TABLET | Freq: Every day | ORAL | 0 refills | Status: DC

## 2017-04-23 NOTE — Discharge Instructions (Signed)
Take Prednisone as prescribed. Use over the counter calamine lotion and benadryl cream, as well as over the counter antihistamines like benadryl or zyrtec to help with itching. Continue your usual home medications. Get plenty of rest and drink plenty of fluids. Avoid any known triggers. Please followup with your primary doctor in 1 week for recheck of symptoms. Return to the ER for changes or worsening symptoms

## 2017-04-23 NOTE — ED Triage Notes (Signed)
Last week pt noticed itchy rash on right arm, now also on bilateral thighs. Pt states this is poison ivy/poison sumac from her bushes.

## 2017-04-23 NOTE — ED Provider Notes (Signed)
[facility] DEPT Provider Note   CSN: [id number] Arrival date & time: 04/23/17  1635  By signing my name below, I, Cynda Acres, attest that this documentation has been prepared under the direction and in the presence of  [address] [employer]. Electronically Signed: Cynda Acres, Scribe. 04/23/17. 5:10 PM.  History   Chief Complaint Chief Complaint  Patient presents with  . Rash  . Pruritis    HPI Comments: Monique Hoffman is a 36 y.o. female with no pertinent past medical history, who presents to the Emergency Department complaining of gradual onset, persistent rash to the entire right arm, which appeared one week ago. Patient believes she has poison oak/ivy/sumac, because she was attempting to remove a bush, she states this bush does have both poison oak, ivy, and sumac; states she already got a rash from the same bush recently, but the rash improved with OTC remedies. This rash started the day after she attempted to remove the bush again. Patient states the rash is a constant burning and itching, but denies pain. Patient reports applying benadryl lotion and calamine lotion with mild relief, no known aggravating factors. Patient denies any swelling, erythema, warmth, drainage, fevers, chills, CP, SOB, abd pain, N/V/D/C, myalgias, arthralgias, numbness, tingling, focal weakness, or any other complaints at this time. No exposure to similar rash. No other known changes in exposures recently.   The history is provided by the patient and medical records. No language interpreter was used.  Rash   This is a recurrent problem. The current episode started more than 1 week ago. The problem has been gradually worsening. The problem is associated with plant contact. There has been no fever. The rash is present on the right arm. The pain is at a severity of 0/10. The patient is experiencing no pain. The pain has been constant since onset. Associated symptoms include itching. Pertinent negatives  include no pain and no weeping. She has tried anti-itch cream (benadryl cream and calamine lotion) for the symptoms. The treatment provided mild relief.    No past medical history on file.  There are no active problems to display for this patient.   No past surgical history on file.  OB History    No data available       Home Medications    Prior to Admission medications   Medication Sig Start Date End Date Taking? Authorizing Provider  diazepam (VALIUM) 5 MG tablet Take 1 tablet (5 mg total) by mouth 2 (two) times daily. Patient not taking: Reported on 04/05/2017 02/27/13   Glade Nurse A, PA-C  ibuprofen (ADVIL,MOTRIN) 200 MG tablet Take 400 mg by mouth every 6 (six) hours as needed for mild pain. Pain     [provider]    Family History No family history on file.  Social History Social History  Substance Use Topics  . Smoking status: Current Every Day Smoker  . Smokeless tobacco: Never Used  . Alcohol use Yes     Allergies   Patient has no known allergies.   Review of Systems Review of Systems  Constitutional: Negative for chills and fever.  Respiratory: Negative for shortness of breath.   Cardiovascular: Negative for chest pain.  Gastrointestinal: Negative for abdominal pain, constipation, diarrhea, nausea and vomiting.  Genitourinary: Negative for dysuria and hematuria.  Musculoskeletal: Negative for arthralgias and myalgias.  Skin: Positive for itching and rash (right arm).  Allergic/Immunologic: Negative for immunocompromised state.  Neurological: Negative for weakness and numbness.  Psychiatric/Behavioral: Negative for confusion.  10 Systems reviewed and all are negative for acute change except as noted in the HPI.  Physical Exam Updated Vital Signs BP 107/84 (BP Location: Left Arm)   Pulse 82   Temp 98.4 F (36.9 C) (Oral)   Resp 16   SpO2 97%   Physical Exam  Constitutional: She is oriented to person, place, and time. Vital  signs are normal. She appears well-developed and well-nourished.  Non-toxic appearance. No distress.  Afebrile, nontoxic, NAD  HENT:  Head: Normocephalic and atraumatic.  Mouth/Throat: Mucous membranes are normal.  Eyes: Conjunctivae and EOM are normal. Right eye exhibits no discharge. Left eye exhibits no discharge.  Neck: Normal range of motion. Neck supple.  Cardiovascular: Normal rate and intact distal pulses.   Pulmonary/Chest: Effort normal. No respiratory distress.  Abdominal: Normal appearance. She exhibits no distension.  Musculoskeletal: Normal range of motion.  Neurological: She is alert and oriented to person, place, and time. She has normal strength. No sensory deficit.  Skin: Skin is warm, dry and intact. Rash noted. Rash is maculopapular.  Somewhat maculopapular-appearing rash to the right arm mostly in a linear distribution, some areas have opened, but no drainage, no evidence of cellulitis, no interdigital webspace involvement, no other areas of similar rash.   Psychiatric: She has a normal mood and affect. Her behavior is normal.  Nursing note and vitals reviewed.    ED Treatments / Results  DIAGNOSTIC STUDIES: Oxygen Saturation is 97% on RA, normal by my interpretation.    COORDINATION OF CARE: 5:10 PM Discussed treatment plan with pt at bedside and pt agreed to plan, which includes prednisone.   Labs (all labs ordered are listed, but only abnormal results are displayed) Labs Reviewed - No data to display  EKG  EKG Interpretation None       Radiology No results found.  Procedures Procedures (including critical care time)  Medications Ordered in ED Medications - No data to display   Initial Impression / Assessment and Plan / ED Course  I have reviewed the triage vital signs and the nursing notes.  Pertinent labs & imaging results that were available during my care of the patient were reviewed by me and considered in my medical decision making (see  chart for details).     35 y.o. female here with itchy rash to R arm after cutting poison ivy/oak/sumac bush. On exam, rash consistent with contact dermatitis, no evidence of scabies or secondary infection. Advised continuation of home OTC remedies for symptomatic relief, and will rx steroid taper to help with poison ivy/oak/sumac contact dermatitis. F/up with PCP in 1wk for recheck. I explained the diagnosis and have given explicit precautions to return to the ER including for any other new or worsening symptoms. The patient understands and accepts the medical plan as it's been dictated and I have answered their questions. Discharge instructions concerning home care and prescriptions have been given. The patient is STABLE and is discharged to home in good condition.   I personally performed the services described in this documentation, which was scribed in my presence. The recorded information has been reviewed and is accurate.    Final Clinical Impressions(s) / ED Diagnoses   Final diagnoses:  Contact dermatitis due to plants, except food, unspecified contact dermatitis type  Poison ivy dermatitis    New Prescriptions New Prescriptions   PREDNISONE (STERAPRED UNI-PAK 21 TAB) 10 MG (21) TBPK TABLET    Take by mouth daily. Take 6 tabs by mouth daily  for 2  days, then 5 tabs for 2 days, then 4 tabs for 2 days, then 3 tabs for 2 days, 2 tabs for 2 days, then 1 tab by mouth daily for 2 days     [address], [place], [place]PA-C 04/23/17 1743    Little, Ambrose Finlandachel Morgan, MD 04/25/17 [id number]

## 2017-11-20 NOTE — L&D Delivery Note (Addendum)
Patient: Monique SkatesCrystal D Hoffman MRN: [id number]  GBS status: negative, IAP given NA  Patient is a 37 y.o. now G1P1 s/p NSVD at 4762w6d, who was admitted for SOL. AROM 3h 1965m prior to delivery with clear fluid. Baby was delivered with 1 loose nuchal cord which was easily reduced.    Delivery Note At 12:54 AM a viable female was delivered via Vaginal, Spontaneous (Presentation: ROA;vertex  ).  APGAR: 8, 9; weight pending .   Placenta status: intact.  Cord:  3 vessel with the following complications:none .  Cord pH: pending  Anesthesia:  epidural Episiotomy: None Lacerations: 2nd degree;Perineal Suture Repair: 3.0 vicryl Est. Blood Loss (mL):    Mom to postpartum.  Baby to Couplet care / Skin to Skin.  Monique Hoffman 10/24/2018, 1:21 AM   Head delivered ROA. Loose nuchal cord present. Shoulder and body delivered in usual fashion. Infant with spontaneous cry, placed on mother's abdomen, dried and bulb suctioned. Cord clamped x 2 after 1-minute delay, and cut by family member. Cord blood drawn. Placenta delivered spontaneously with gentle cord traction. Fundus firm with massage and Pitocin. Perineum inspected and found to have 2nd degree laceration, which was repaired with ~8 sutures with good hemostasis achieved.  I confirm that I have verified the information documented in the resident's note and that I have also personally reperformed the physical exam and all medical decision making activities.  I was gloved and present for entire delivery SVD without incident No difficulty with shoulders Lacerations as listed above Repair of same supervised by me Monique Hoffman, CNM  Please schedule this patient for Postpartum visit in: 4 weeks with the following provider: Any provider For C/S patients schedule nurse incision check in weeks 2 weeks: no High risk pregnancy complicated by: alcohol use in pregnancy Delivery mode:  SVD Anticipated Birth Control:  Plans Interval BTL PP Procedures  needed: none  Schedule Integrated BH visit: no

## 2018-04-01 ENCOUNTER — Other Ambulatory Visit (HOSPITAL_COMMUNITY)
Admission: RE | Admit: 2018-04-01 | Discharge: 2018-04-01 | Disposition: A | Discharge status: Home / Self Care | Payer: Medicaid Other | Source: Ambulatory Visit | Attending: Certified Nurse Midwife | Admitting: Certified Nurse Midwife

## 2018-04-01 ENCOUNTER — Ambulatory Visit (INDEPENDENT_AMBULATORY_CARE_PROVIDER_SITE_OTHER): Payer: Medicaid Other | Admitting: Certified Nurse Midwife

## 2018-04-01 ENCOUNTER — Encounter: Payer: Self-pay | Admitting: Certified Nurse Midwife

## 2018-04-01 VITALS — BP 118/79 | HR 80 | Wt 119.0 lb

## 2018-04-01 DIAGNOSIS — Z3400 Encounter for supervision of normal first pregnancy, unspecified trimester: Secondary | ICD-10-CM | POA: Insufficient documentation

## 2018-04-01 DIAGNOSIS — O99311 Alcohol use complicating pregnancy, first trimester: Secondary | ICD-10-CM

## 2018-04-01 DIAGNOSIS — Z832 Family history of diseases of the blood and blood-forming organs and certain disorders involving the immune mechanism: Secondary | ICD-10-CM

## 2018-04-01 DIAGNOSIS — Z3401 Encounter for supervision of normal first pregnancy, first trimester: Secondary | ICD-10-CM | POA: Insufficient documentation

## 2018-04-01 DIAGNOSIS — F1010 Alcohol abuse, uncomplicated: Secondary | ICD-10-CM

## 2018-04-01 DIAGNOSIS — O219 Vomiting of pregnancy, unspecified: Secondary | ICD-10-CM

## 2018-04-01 MED ORDER — DOXYLAMINE-PYRIDOXINE 10-10 MG PO TBEC: DELAYED_RELEASE_TABLET | ORAL | 4 refills | Status: DC

## 2018-04-01 MED ORDER — VITAFOL-NANO 18-0.6-0.4 MG PO TABS
1.0000 | ORAL_TABLET | Freq: Every day | ORAL | 12 refills | Status: DC
Start: 2018-04-01 — End: 2018-08-08

## 2018-04-01 NOTE — Progress Notes (Addendum)
Subjective:   Monique Hoffman is a 37 y.o. G1P0 at [redacted]w[redacted]d by LMP being seen today for her first obstetrical visit.  Her obstetrical history is significant for smoker and alcohol use in pregnancy: 2 beers/day.  1 pack a day/ciggs. Patient does not intend to breast feed. Pregnancy history fully reviewed.  Works at a paper company on a line.   Patient reports nausea, no bleeding, no contractions, no cramping, no leaking and vomiting.  HISTORY: OB History  Gravida Para Term Preterm AB Living  1 0 0 0 0 0  SAB TAB Ectopic Multiple Live Births  0 0 0 0 0    # Outcome Date GA Lbr Len/2nd Weight Sex Delivery Anes PTL Lv  1 Current             Last pap smear was done unknown  Past Medical History:  Diagnosis Date  . ADHD    History reviewed. No pertinent surgical history. Family History  Problem Relation Age of Onset  . Heart disease Father   . Heart disease Maternal Grandmother   . Heart disease Maternal Grandfather    Social History   Tobacco Use  . Smoking status: Current Every Day Smoker    Packs/day: 1.00    Types: Cigarettes  . Smokeless tobacco: Never Used  Substance Use Topics  . Alcohol use: Yes    Alcohol/week: 7.2 oz    Types: 12 Cans of beer per week    Comment: 1-2 beer q other day  . Drug use: Yes    Types: Marijuana    Comment: last smoke 1 week ago   No Known Allergies Current Outpatient Medications on File Prior to Visit  Medication Sig Dispense Refill  . prenatal vitamin w/FE, FA (PRENATAL 1 + 1) 27-1 MG TABS tablet Take 1 tablet by mouth daily at 12 noon.     No current facility-administered medications on file prior to visit.     Review of Systems Pertinent items noted in HPI and remainder of comprehensive ROS otherwise negative.  Exam   Vitals:   04/01/18 1319  BP: 118/79  Pulse: 80  Weight: 119 lb (54 kg)   Fetal Heart Rate (bpm): 172; doppler  Uterus:     Pelvic Exam: Perineum: no hemorrhoids, normal perineum   Vulva: normal  external genitalia, no lesions   Vagina:  normal mucosa, normal discharge   Cervix: no lesions and normal, pap smear done.    Adnexa: normal adnexa and no mass, fullness, tenderness   Bony Pelvis: average  System: General: well-developed, well-nourished female in no acute distress   Breast:  normal appearance, no masses or tenderness   Skin: normal coloration and turgor, no rashes   Neurologic: oriented, normal, negative, normal mood   Extremities: normal strength, tone, and muscle mass, ROM of all joints is normal   HEENT PERRLA, extraocular movement intact and sclera clear, anicteric   Mouth/Teeth mucous membranes moist, pharynx normal without lesions and dental hygiene good   Neck supple and no masses   Cardiovascular: regular rate and rhythm   Respiratory:  no respiratory distress, normal breath sounds   Abdomen: soft, non-tender; bowel sounds normal; no masses,  no organomegaly     Assessment:   Pregnancy: G1P0 Patient Active Problem List   Diagnosis Date Noted  . Supervision of normal first pregnancy, antepartum 04/01/2018  . Family history of sickle cell trait in father 04/01/2018     Plan:  1. Supervision of normal  first pregnancy, antepartum    - Obstetric Panel, Including HIV - Culture, OB Urine - Cytology - PAP - Genetic Screening - Hemoglobinopathy evaluation - Cervicovaginal ancillary only - Inheritest Core(CF97,SMA,FraX) - Vitamin D (25 hydroxy) - Hemoglobin A1c - Prenatal-Fe Fum-Methf-FA w/o A (VITAFOL-NANO) 18-0.6-0.4 MG TABS; Take 1 tablet by mouth daily.  Dispense: 30 tablet; Refill: 12  2. Family history of sickle cell trait in father     3. Nausea/vomiting in pregnancy    - Doxylamine-Pyridoxine (DICLEGIS) 10-10 MG TBEC; Take 1 tablet with breakfast and lunch.  Take 2 tablets at bedtime.  Dispense: 100 tablet; Refill: 4   4. 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 Continue prenatal vitamins. Genetic Screening discussed, NIPS: ordered. Ultrasound discussed; fetal anatomic survey: ordered. Problem list reviewed and updated. The nature of [facility] with multiple MDs and other Advanced Practice Providers was explained to patient; also emphasized that residents, students are part of our team. Routine obstetric precautions reviewed. Return in about 1 month (around 04/29/2018) for ROB.     Orvilla Cornwall, CNM [facility], [facility]

## 2018-04-02 LAB — CERVICOVAGINAL ANCILLARY ONLY
Bacterial vaginitis: POSITIVE — AB
CANDIDA VAGINITIS: NEGATIVE
CHLAMYDIA, DNA PROBE: NEGATIVE
NEISSERIA GONORRHEA: NEGATIVE
TRICH (WINDOWPATH): NEGATIVE

## 2018-04-03 ENCOUNTER — Other Ambulatory Visit: Payer: Self-pay | Admitting: Certified Nurse Midwife

## 2018-04-03 ENCOUNTER — Encounter: Payer: Medicaid Other | Admitting: Certified Nurse Midwife

## 2018-04-03 DIAGNOSIS — B9689 Other specified bacterial agents as the cause of diseases classified elsewhere: Secondary | ICD-10-CM

## 2018-04-03 DIAGNOSIS — N760 Acute vaginitis: Principal | ICD-10-CM

## 2018-04-03 DIAGNOSIS — Z3400 Encounter for supervision of normal first pregnancy, unspecified trimester: Secondary | ICD-10-CM

## 2018-04-03 LAB — OBSTETRIC PANEL, INCLUDING HIV
Antibody Screen: NEGATIVE
BASOS ABS: 0 10*3/uL (ref 0.0–0.2)
BASOS: 0 %
EOS (ABSOLUTE): 0 10*3/uL (ref 0.0–0.4)
Eos: 0 %
HEMATOCRIT: 40.8 % (ref 34.0–46.6)
HEP B S AG: NEGATIVE
HIV Screen 4th Generation wRfx: NONREACTIVE
Hemoglobin: 13.8 g/dL (ref 11.1–15.9)
IMMATURE GRANS (ABS): 0 10*3/uL (ref 0.0–0.1)
Immature Granulocytes: 0 %
LYMPHS: 20 %
Lymphocytes Absolute: 1.9 10*3/uL (ref 0.7–3.1)
MCH: 28.6 pg (ref 26.6–33.0)
MCHC: 33.8 g/dL (ref 31.5–35.7)
MCV: 85 fL (ref 79–97)
MONOCYTES: 6 %
Monocytes Absolute: 0.6 10*3/uL (ref 0.1–0.9)
NEUTROS PCT: 74 %
Neutrophils Absolute: 7.1 10*3/uL — ABNORMAL HIGH (ref 1.4–7.0)
PLATELETS: 156 10*3/uL (ref 150–379)
RBC: 4.83 x10E6/uL (ref 3.77–5.28)
RDW: 13.8 % (ref 12.3–15.4)
RPR Ser Ql: NONREACTIVE
RUBELLA: 2.96 {index} (ref >0.99)
Rh Factor: POSITIVE
WBC: 9.7 10*3/uL (ref 3.4–10.8)

## 2018-04-03 LAB — HEMOGLOBINOPATHY EVALUATION
HGB C: 0 %
HGB S: 0 %
HGB VARIANT: 0 %
Hemoglobin A2 Quantitation: 2.3 % (ref 1.8–3.2)
Hemoglobin F Quantitation: 0 % (ref 0.0–2.0)
Hgb A: 97.7 % (ref 96.4–98.8)

## 2018-04-03 LAB — CYTOLOGY - PAP
Diagnosis: NEGATIVE
HPV: NOT DETECTED

## 2018-04-03 LAB — HEMOGLOBIN A1C
ESTIMATED AVERAGE GLUCOSE: 97 mg/dL
HEMOGLOBIN A1C: 5 % (ref 4.8–5.6)

## 2018-04-03 LAB — VITAMIN D 25 HYDROXY (VIT D DEFICIENCY, FRACTURES): Vit D, 25-Hydroxy: 17 ng/mL — ABNORMAL LOW (ref 30.0–100.0)

## 2018-04-03 MED ORDER — METRONIDAZOLE 0.75 % VA GEL: 1.0000 | Freq: Two times a day (BID) | VAGINAL | 0 refills | Status: DC

## 2018-04-03 NOTE — Progress Notes (Signed)
BV: metrogel rx.

## 2018-04-04 LAB — CULTURE, OB URINE

## 2018-04-04 LAB — URINE CULTURE, OB REFLEX

## 2018-04-05 ENCOUNTER — Other Ambulatory Visit: Payer: Self-pay | Admitting: Certified Nurse Midwife

## 2018-04-05 DIAGNOSIS — Z3400 Encounter for supervision of normal first pregnancy, unspecified trimester: Secondary | ICD-10-CM

## 2018-04-05 DIAGNOSIS — E559 Vitamin D deficiency, unspecified: Secondary | ICD-10-CM | POA: Insufficient documentation

## 2018-04-05 MED ORDER — VITAMIN D (ERGOCALCIFEROL) 1.25 MG (50000 UNIT) PO CAPS: 50000.0000 [IU] | ORAL_CAPSULE | ORAL | 2 refills | Status: DC

## 2018-04-08 ENCOUNTER — Encounter: Payer: Self-pay | Admitting: Certified Nurse Midwife

## 2018-04-08 ENCOUNTER — Other Ambulatory Visit: Payer: Self-pay | Admitting: Certified Nurse Midwife

## 2018-04-08 DIAGNOSIS — Z3400 Encounter for supervision of normal first pregnancy, unspecified trimester: Secondary | ICD-10-CM

## 2018-04-11 ENCOUNTER — Telehealth: Payer: Self-pay

## 2018-04-11 ENCOUNTER — Other Ambulatory Visit: Payer: Self-pay

## 2018-04-11 ENCOUNTER — Telehealth: Payer: Self-pay | Admitting: *Deleted

## 2018-04-11 DIAGNOSIS — E559 Vitamin D deficiency, unspecified: Secondary | ICD-10-CM

## 2018-04-11 DIAGNOSIS — N760 Acute vaginitis: Secondary | ICD-10-CM

## 2018-04-11 DIAGNOSIS — B9689 Other specified bacterial agents as the cause of diseases classified elsewhere: Secondary | ICD-10-CM

## 2018-04-11 LAB — INHERITEST CORE(CF97,SMA,FRAX)

## 2018-04-11 MED ORDER — METRONIDAZOLE 0.75 % VA GEL: 1.0000 | Freq: Two times a day (BID) | VAGINAL | 0 refills | Status: DC

## 2018-04-11 MED ORDER — VITAMIN D (ERGOCALCIFEROL) 1.25 MG (50000 UNIT) PO CAPS: 50000.0000 [IU] | ORAL_CAPSULE | ORAL | 2 refills | Status: DC

## 2018-04-11 NOTE — Telephone Encounter (Signed)
Returned call to patient, she is at work and will call us back when she gets off at 3:30 pm

## 2018-04-11 NOTE — Progress Notes (Signed)
Reordered Metrogel and Vit. D because pharmacy said they did not receive RX.

## 2018-04-11 NOTE — Telephone Encounter (Signed)
DICELGIS PA [id number] EFFECTIVE 04/11/18-04/06/19  [facility] notified.

## 2018-04-11 NOTE — Telephone Encounter (Signed)
Pt states that all of her prescriptions did not go to the pharmacy she stated only 2 out of the 4 reached the pharmacy and she stated that her insurance will not cover the [place] please call the patient @ [phone] to get her medication squared away, patient has called several times.Marland KitchenMarland KitchenMarland Kitchen

## 2018-04-12 ENCOUNTER — Other Ambulatory Visit: Payer: Self-pay | Admitting: Certified Nurse Midwife

## 2018-04-12 DIAGNOSIS — Z3400 Encounter for supervision of normal first pregnancy, unspecified trimester: Secondary | ICD-10-CM

## 2018-04-28 ENCOUNTER — Inpatient Hospital Stay (HOSPITAL_COMMUNITY)
Admission: AD | Admit: 2018-04-28 | Discharge: 2018-04-28 | Disposition: A | Discharge status: Home / Self Care | Payer: Medicaid Other | Source: Ambulatory Visit | Attending: Obstetrics & Gynecology | Admitting: Obstetrics & Gynecology

## 2018-04-28 ENCOUNTER — Encounter (HOSPITAL_COMMUNITY): Payer: Self-pay | Admitting: *Deleted

## 2018-04-28 DIAGNOSIS — O99332 Smoking (tobacco) complicating pregnancy, second trimester: Secondary | ICD-10-CM | POA: Diagnosis not present

## 2018-04-28 DIAGNOSIS — O9A212 Injury, poisoning and certain other consequences of external causes complicating pregnancy, second trimester: Secondary | ICD-10-CM | POA: Diagnosis not present

## 2018-04-28 DIAGNOSIS — F17210 Nicotine dependence, cigarettes, uncomplicated: Secondary | ICD-10-CM | POA: Diagnosis not present

## 2018-04-28 DIAGNOSIS — Z3A14 14 weeks gestation of pregnancy: Secondary | ICD-10-CM | POA: Diagnosis not present

## 2018-04-28 DIAGNOSIS — R220 Localized swelling, mass and lump, head: Secondary | ICD-10-CM

## 2018-04-28 DIAGNOSIS — O26892 Other specified pregnancy related conditions, second trimester: Secondary | ICD-10-CM | POA: Insufficient documentation

## 2018-04-28 DIAGNOSIS — T7840XA Allergy, unspecified, initial encounter: Secondary | ICD-10-CM | POA: Insufficient documentation

## 2018-04-28 LAB — CBC
HEMATOCRIT: 33.7 % — AB (ref 36.0–46.0)
Hemoglobin: 11.2 g/dL — ABNORMAL LOW (ref 12.0–15.0)
MCH: 28.4 pg (ref 26.0–34.0)
MCHC: 33.2 g/dL (ref 30.0–36.0)
MCV: 85.3 fL (ref 78.0–100.0)
Platelets: 201 10*3/uL (ref 150–400)
RBC: 3.95 MIL/uL (ref 3.87–5.11)
RDW: 14.3 % (ref 11.5–15.5)
WBC: 7.1 10*3/uL (ref 4.0–10.5)

## 2018-04-28 LAB — URINALYSIS, ROUTINE W REFLEX MICROSCOPIC
Bilirubin Urine: NEGATIVE
GLUCOSE, UA: NEGATIVE mg/dL
HGB URINE DIPSTICK: NEGATIVE
Ketones, ur: NEGATIVE mg/dL
Nitrite: NEGATIVE
Protein, ur: NEGATIVE mg/dL
SPECIFIC GRAVITY, URINE: 1.013 (ref 1.005–1.030)
pH: 9 — ABNORMAL HIGH (ref 5.0–8.0)

## 2018-04-28 MED ORDER — PREDNISONE 20 MG PO TABS: 60.0000 mg | ORAL_TABLET | Freq: Every day | ORAL | 0 refills | Status: AC

## 2018-04-28 MED ORDER — CETIRIZINE HCL 10 MG PO TABS: 10.0000 mg | ORAL_TABLET | Freq: Every day | ORAL | 1 refills | Status: DC

## 2018-04-28 MED ORDER — LORATADINE 10 MG PO TABS
10.0000 mg | ORAL_TABLET | Freq: Every day | ORAL | Status: DC
  Administered 2018-04-28: 10 mg via ORAL
  Filled 2018-04-28: qty 1

## 2018-04-28 MED ORDER — PREDNISONE 10 MG PO TABS
60.0000 mg | ORAL_TABLET | Freq: Once | ORAL | Status: AC
  Administered 2018-04-28: 60 mg via ORAL
  Filled 2018-04-28: qty 1

## 2018-04-28 NOTE — MAU Note (Addendum)
Patient c/o  +facial and hand swelling Ongoing since last week States started when she went to jail; but when she goes home it resolves and then restarts back in jail Face feels numbs  Endorses having passed out yesterday. Does not remember what happened. Just remembers waking up to a bunch of officers over her.

## 2018-04-28 NOTE — MAU Provider Note (Signed)
History     CSN: [id number]  Arrival date and time: 04/28/18 0800   First Provider Initiated Contact with Patient 04/28/18 [phone]      Chief Complaint  Patient presents with  . Facial Swelling  . hand swelling   HPI   [name] is a 37 y.o. female G1P0 @ [redacted]w[redacted]d here in MAU with complaints of facial swelling and hand swelling. Symptoms started last week while she was in a jail cell. Says it returned this weekend while she was back in the jail cell. Says during the week she is at home and does not have this problem, and as soon as she returns to jail the symptoms return. No history of allergies (food or seasonal), no history of asthma. Says she has not taken any medications for the symptoms. No pain.   OB History    Gravida  1   Para      Term      Preterm      AB      Living        SAB      TAB      Ectopic      Multiple      Live Births              Past Medical History:  Diagnosis Date  . ADHD     Past Surgical History:  Procedure Laterality Date  . NO PAST SURGERIES      Family History  Problem Relation Age of Onset  . Heart disease Father   . Heart disease Maternal Grandmother   . Heart disease Maternal Grandfather     Social History   Tobacco Use  . Smoking status: Current Every Day Smoker    Packs/day: 1.00    Types: Cigarettes  . Smokeless tobacco: Never Used  Substance Use Topics  . Alcohol use: Yes    Alcohol/week: 7.2 oz    Types: 12 Cans of beer per week    Comment: 1-2 beer q other day  . Drug use: Yes    Types: Marijuana    Comment: last smoke 1 week ago    Allergies: No Known Allergies  Medications Prior to Admission  Medication Sig Dispense Refill Last Dose  . Doxylamine-Pyridoxine (DICLEGIS) 10-10 MG TBEC Take 1 tablet with breakfast and lunch.  Take 2 tablets at bedtime. 100 tablet 4   . metroNIDAZOLE (METROGEL VAGINAL) 0.75 % vaginal gel Place 1 Applicatorful vaginally 2 (two) times daily. 70 g 0   .  prenatal vitamin w/FE, FA (PRENATAL 1 + 1) 27-1 MG TABS tablet Take 1 tablet by mouth daily at 12 noon.   Taking  . Prenatal-Fe Fum-Methf-FA w/o A (VITAFOL-NANO) 18-0.6-0.4 MG TABS Take 1 tablet by mouth daily. 30 tablet 12   . Vitamin D, Ergocalciferol, (DRISDOL) 50000 units CAPS capsule Take 1 capsule (50,000 Units total) by mouth every 7 (seven) days. 30 capsule 2    Results for orders placed or performed during the hospital encounter of 04/28/18 (from the past 48 hour(s))  Urinalysis, Routine w reflex microscopic     Status: Abnormal   Collection Time: 04/28/18  8:03 AM  Result Value Ref Range   Color, Urine YELLOW YELLOW   APPearance CLOUDY (A) CLEAR   Specific Gravity, Urine 1.013 1.005 - 1.030   pH 9.0 (H) 5.0 - 8.0   Glucose, UA NEGATIVE NEGATIVE mg/dL   Hgb urine dipstick NEGATIVE NEGATIVE   Bilirubin Urine NEGATIVE NEGATIVE  Ketones, ur NEGATIVE NEGATIVE mg/dL   Protein, ur NEGATIVE NEGATIVE mg/dL   Nitrite NEGATIVE NEGATIVE   Leukocytes, UA TRACE (A) NEGATIVE   RBC / HPF 0-5 0 - 5 RBC/hpf   Bacteria, UA RARE (A) NONE SEEN   Squamous Epithelial / LPF 6-10 0 - 5   Mucus PRESENT     Comment: Performed at [facility], [address] [place], [place] [address]  CBC     Status: Abnormal   Collection Time: 04/28/18  8:51 AM  Result Value Ref Range   WBC 7.1 4.0 - 10.5 K/uL   RBC 3.95 3.87 - 5.11 MIL/uL   Hemoglobin 11.2 (L) 12.0 - 15.0 g/dL   HCT 19.133.7 (L) 47.836.0 - 29.546.0 %   MCV 85.3 78.0 - 100.0 fL   MCH 28.4 26.0 - 34.0 pg   MCHC 33.2 30.0 - 36.0 g/dL   RDW 62.114.3 30.811.5 - 65.715.5 %   Platelets 201 150 - 400 K/uL    Comment: Performed at [facility], [address] [place], [place] [address]   Review of Systems  Constitutional: Negative for fever.  Respiratory: Negative for shortness of breath and wheezing.   Cardiovascular: Negative for chest pain.   Physical Exam   Blood pressure 110/65, pulse 63, temperature 97.7 F (36.5 C), temperature source Oral, resp.  rate 16, last menstrual period 01/18/2018, SpO2 100 %.  Physical Exam  Constitutional: She is oriented to person, place, and time. She appears well-developed and well-nourished. No distress.  HENT:  Head: Normocephalic. Head is without raccoon's eyes and without Battle's sign.  Mouth/Throat: Oropharynx is clear and moist. No oropharyngeal exudate, posterior oropharyngeal edema, posterior oropharyngeal erythema or tonsillar abscesses.  Bilateral facial/ eye swelling noted.   Cardiovascular: Normal rate.  Respiratory: Effort normal and breath sounds normal. No respiratory distress. She has no wheezes. She has no rales.  GI: Soft.  Musculoskeletal: Normal range of motion.  Neurological: She is alert and oriented to person, place, and time.  Skin: Skin is warm. She is not diaphoretic.  Psychiatric: Her behavior is normal.    MAU Course  Procedures  None  MDM  Pulse ox 100 % on RA.  Prednisone given 60 mg in MAU, claritin given PO. Patient sleeping in the bed, no sign of distress. No abnormal breathing.  + fetal heart tones via doppler   Assessment and Plan   A:  1. Allergic reaction, initial encounter   2. Facial swelling     P:  Discharge back to detention facility.  Rx: Zyrtec, prednisone X 5 day If [facility] worsen [facility]  Gisel Vipond, Harolyn RutherfordJennifer I, NP 04/28/2018 1:41 PM

## 2018-04-28 NOTE — Discharge Instructions (Signed)
Allergies An allergy is when your body reacts to a substance in a way that is not normal. An allergic reaction can happen after you:  Eat something.  Breathe in something.  Touch something.  You can be allergic to:  Things that are only around during certain seasons, like molds and pollens.  Foods.  Drugs.  Insects.  Animal dander.  What are the signs or symptoms?  Puffiness (swelling). This may happen on the lips, face, tongue, mouth, or throat.  Sneezing.  Coughing.  Breathing loudly (wheezing).  Stuffy nose.  Tingling in the mouth.  A rash.  Itching.  Itchy, red, puffy areas of skin (hives).  Watery eyes.  Throwing up (vomiting).  Watery poop (diarrhea).  Dizziness.  Feeling faint or fainting.  Trouble breathing or swallowing.  A tight feeling in the chest.  A fast heartbeat. How is this diagnosed? Allergies can be diagnosed with:  A medical and family history.  Skin tests.  Blood tests.  A food diary. A food diary is a record of all the foods, drinks, and symptoms you have each day.  The results of an elimination diet. This diet involves making sure not to eat certain foods and then seeing what happens when you start eating them again.  How is this treated? There is no cure for allergies, but allergic reactions can be treated with medicine. Severe reactions usually need to be treated at a hospital. How is this prevented? The best way to prevent an allergic reaction is to avoid the thing you are allergic to. Allergy shots and medicines can also help prevent reactions in some cases. This information is not intended to replace advice given to you by your health care provider. Make sure you discuss any questions you have with your health care provider. Document Released: 03/03/2013 Document Revised: 07/03/2016 Document Reviewed: 08/18/2014 Elsevier Interactive Patient Education  2018 Elsevier Inc.  

## 2018-04-29 ENCOUNTER — Ambulatory Visit (INDEPENDENT_AMBULATORY_CARE_PROVIDER_SITE_OTHER): Payer: Medicaid Other | Admitting: Certified Nurse Midwife

## 2018-04-29 VITALS — Wt 128.0 lb

## 2018-04-29 DIAGNOSIS — Z3401 Encounter for supervision of normal first pregnancy, first trimester: Secondary | ICD-10-CM

## 2018-04-29 DIAGNOSIS — Z3400 Encounter for supervision of normal first pregnancy, unspecified trimester: Secondary | ICD-10-CM

## 2018-04-29 DIAGNOSIS — E559 Vitamin D deficiency, unspecified: Secondary | ICD-10-CM

## 2018-04-29 NOTE — Progress Notes (Signed)
   PRENATAL VISIT NOTE  Subjective:  Monique Hoffman is a 37 y.o. G1P0 at 2438w3d being seen today for ongoing prenatal care.  She is currently monitored for the following issues for this low-risk pregnancy and has Supervision of normal first pregnancy, antepartum; Family history of sickle cell trait in father; Alcohol abuse affecting pregnancy in first trimester; and Vitamin D deficiency on their problem list.  Patient reports no bleeding, no contractions, no cramping, no leaking and allergic reaction while at jail this weekend, states was bit by a tick about 4 weeks ago, no rash from the tick bite noted.  Contractions: Not present. Vag. Bleeding: None.   . Denies leaking of fluid.   The following portions of the patient's history were reviewed and updated as appropriate: allergies, current medications, past family history, past medical history, past social history, past surgical history and problem list. Problem list updated.  Objective:   Vitals:   04/29/18 1554  Weight: 128 lb (58.1 kg)    Fetal Status: Fetal Heart Rate (bpm): 166; doppler         General:  Alert, oriented and cooperative. Patient is in no acute distress.  Skin: Skin is warm and dry. No rash noted.   Cardiovascular: Normal heart rate noted  Respiratory: Normal respiratory effort, no problems with respiration noted  Abdomen: Soft, gravid, appropriate for gestational age.  Pain/Pressure: Absent     Pelvic: Cervical exam deferred        Extremities: Normal range of motion.  Edema: Trace  Mental Status: Normal mood and affect. Normal behavior. Normal judgment and thought content.   Assessment and Plan:  Pregnancy: G1P0 at 2038w3d  1. Supervision of normal first pregnancy, antepartum     UDS attempted, unable to urinate.  Has anatomy US scheduled.  In jail on the weekends d/t hx of DUI.    2. Vitamin D deficiency     Taking weekly vitamin D.   Preterm labor symptoms and general obstetric precautions including but  not limited to vaginal bleeding, contractions, leaking of fluid and fetal movement were reviewed in detail with the patient. Please refer to After Visit Summary for other counseling recommendations.  Return in about 1 month (around 05/27/2018) for ROB.  Future Appointments  Date Time Provider Department Center  06/04/2018  3:30 PM [facility] US 5 [facility]    Roe Coombsachelle A Galdino Hinchman, CNM

## 2018-04-29 NOTE — Progress Notes (Signed)
Patient reports not feeling fetal movement yet, denies pain. Pt states that she goes to jail on the weekends, and has facial and hand swelling every time that she goes. Pt reports no unusual symptoms during the week while she is at home; and states that she was bitten by a tick a few weeks ago. Pt has not picked up zyrtec and prednisone from the pharmacy yet.

## 2018-04-30 ENCOUNTER — Encounter: Payer: Self-pay | Admitting: *Deleted

## 2018-05-29 ENCOUNTER — Encounter (HOSPITAL_COMMUNITY): Payer: Self-pay

## 2018-06-03 ENCOUNTER — Telehealth: Payer: Self-pay | Admitting: *Deleted

## 2018-06-03 NOTE — Telephone Encounter (Signed)
Left vmail for patient to callback and schedule OB visit patient had not been seen since 04/29/2018.Marland Kitchen.Marland Kitchen..Marland Kitchen

## 2018-06-04 ENCOUNTER — Ambulatory Visit (HOSPITAL_COMMUNITY)
Admission: RE | Admit: 2018-06-04 | Discharge: 2018-06-04 | Disposition: A | Discharge status: Home / Self Care | Payer: Medicaid Other | Source: Ambulatory Visit | Attending: Certified Nurse Midwife | Admitting: Certified Nurse Midwife

## 2018-06-04 ENCOUNTER — Encounter (HOSPITAL_COMMUNITY): Payer: Self-pay

## 2018-06-04 DIAGNOSIS — F1010 Alcohol abuse, uncomplicated: Secondary | ICD-10-CM | POA: Diagnosis not present

## 2018-06-04 DIAGNOSIS — O09522 Supervision of elderly multigravida, second trimester: Secondary | ICD-10-CM | POA: Diagnosis not present

## 2018-06-04 DIAGNOSIS — Z363 Encounter for antenatal screening for malformations: Secondary | ICD-10-CM | POA: Diagnosis not present

## 2018-06-04 DIAGNOSIS — O99312 Alcohol use complicating pregnancy, second trimester: Secondary | ICD-10-CM | POA: Insufficient documentation

## 2018-06-04 DIAGNOSIS — Z3400 Encounter for supervision of normal first pregnancy, unspecified trimester: Secondary | ICD-10-CM

## 2018-06-04 DIAGNOSIS — Z3A19 19 weeks gestation of pregnancy: Secondary | ICD-10-CM | POA: Diagnosis not present

## 2018-06-04 DIAGNOSIS — O99311 Alcohol use complicating pregnancy, first trimester: Secondary | ICD-10-CM

## 2018-06-05 ENCOUNTER — Other Ambulatory Visit (HOSPITAL_COMMUNITY): Payer: Self-pay | Admitting: *Deleted

## 2018-06-05 DIAGNOSIS — O99310 Alcohol use complicating pregnancy, unspecified trimester: Secondary | ICD-10-CM

## 2018-07-30 ENCOUNTER — Encounter (HOSPITAL_COMMUNITY): Payer: Self-pay

## 2018-07-30 ENCOUNTER — Ambulatory Visit (HOSPITAL_COMMUNITY)
Admission: RE | Admit: 2018-07-30 | Discharge: 2018-07-30 | Disposition: A | Discharge status: Home / Self Care | Source: Ambulatory Visit | Attending: Certified Nurse Midwife | Admitting: Certified Nurse Midwife

## 2018-08-05 ENCOUNTER — Other Ambulatory Visit: Payer: Self-pay

## 2018-08-05 ENCOUNTER — Inpatient Hospital Stay (HOSPITAL_COMMUNITY)
Admission: AD | Admit: 2018-08-05 | Discharge: 2018-08-05 | Disposition: A | Discharge status: Home / Self Care | Payer: Medicaid Other | Source: Ambulatory Visit | Attending: Obstetrics & Gynecology | Admitting: Obstetrics & Gynecology

## 2018-08-05 ENCOUNTER — Encounter (HOSPITAL_COMMUNITY): Payer: Self-pay | Admitting: *Deleted

## 2018-08-05 DIAGNOSIS — O26893 Other specified pregnancy related conditions, third trimester: Secondary | ICD-10-CM | POA: Diagnosis present

## 2018-08-05 DIAGNOSIS — F909 Attention-deficit hyperactivity disorder, unspecified type: Secondary | ICD-10-CM | POA: Diagnosis not present

## 2018-08-05 DIAGNOSIS — O4703 False labor before 37 completed weeks of gestation, third trimester: Secondary | ICD-10-CM | POA: Diagnosis not present

## 2018-08-05 DIAGNOSIS — Z79899 Other long term (current) drug therapy: Secondary | ICD-10-CM | POA: Insufficient documentation

## 2018-08-05 DIAGNOSIS — B373 Candidiasis of vulva and vagina: Secondary | ICD-10-CM | POA: Insufficient documentation

## 2018-08-05 DIAGNOSIS — Z3A28 28 weeks gestation of pregnancy: Secondary | ICD-10-CM | POA: Insufficient documentation

## 2018-08-05 DIAGNOSIS — R109 Unspecified abdominal pain: Secondary | ICD-10-CM | POA: Diagnosis present

## 2018-08-05 DIAGNOSIS — B3731 Acute candidiasis of vulva and vagina: Secondary | ICD-10-CM

## 2018-08-05 DIAGNOSIS — O479 False labor, unspecified: Secondary | ICD-10-CM

## 2018-08-05 DIAGNOSIS — F17210 Nicotine dependence, cigarettes, uncomplicated: Secondary | ICD-10-CM | POA: Diagnosis not present

## 2018-08-05 DIAGNOSIS — O98813 Other maternal infectious and parasitic diseases complicating pregnancy, third trimester: Secondary | ICD-10-CM | POA: Diagnosis not present

## 2018-08-05 DIAGNOSIS — O99333 Smoking (tobacco) complicating pregnancy, third trimester: Secondary | ICD-10-CM | POA: Insufficient documentation

## 2018-08-05 DIAGNOSIS — O99343 Other mental disorders complicating pregnancy, third trimester: Secondary | ICD-10-CM | POA: Diagnosis not present

## 2018-08-05 HISTORY — DX: Other specified health status: Z78.9

## 2018-08-05 LAB — WET PREP, GENITAL
Clue Cells Wet Prep HPF POC: NONE SEEN
Sperm: NONE SEEN
TRICH WET PREP: NONE SEEN

## 2018-08-05 LAB — URINALYSIS, ROUTINE W REFLEX MICROSCOPIC
Bilirubin Urine: NEGATIVE
Glucose, UA: NEGATIVE mg/dL
Hgb urine dipstick: NEGATIVE
Ketones, ur: NEGATIVE mg/dL
Leukocytes, UA: NEGATIVE
Nitrite: NEGATIVE
PH: 5 (ref 5.0–8.0)
PROTEIN: NEGATIVE mg/dL
SPECIFIC GRAVITY, URINE: 1.025 (ref 1.005–1.030)

## 2018-08-05 LAB — FETAL FIBRONECTIN: Fetal Fibronectin: NEGATIVE

## 2018-08-05 MED ORDER — TERCONAZOLE 0.4 % VA CREA: 1.0000 | TOPICAL_CREAM | Freq: Every day | VAGINAL | 0 refills | Status: DC

## 2018-08-05 MED ORDER — NIFEDIPINE 10 MG PO CAPS
10.0000 mg | ORAL_CAPSULE | ORAL | Status: AC
  Administered 2018-08-05 (×3): 10 mg via ORAL
  Filled 2018-08-05 (×3): qty 1

## 2018-08-05 MED ORDER — LACTATED RINGERS IV BOLUS
1000.0000 mL | Freq: Once | INTRAVENOUS | Status: AC
Start: 2018-08-05 — End: 2018-08-05
  Administered 2018-08-05: 1000 mL via INTRAVENOUS

## 2018-08-05 MED ORDER — NIFEDIPINE 10 MG PO CAPS: 10.0000 mg | ORAL_CAPSULE | Freq: Four times a day (QID) | ORAL | 0 refills | Status: DC | PRN

## 2018-08-05 NOTE — MAU Provider Note (Signed)
History     CSN: [id number]  Arrival date and time: 08/05/18 1337   First Provider Initiated Contact with Patient 08/05/18 1423      Chief Complaint  Patient presents with  . Abdominal Pain   HPI   [name] is a 37 y.o. female. G1P0 @ [redacted]w[redacted]d here in MAU with contractions/abdominal pain. Says the contractions/ abdominal pain started a few weeks ago. In the last 24 hours the pain has gotten worse, the pain is taking her breath away. She is feeling the pain every 2-3 minutes. Last intercourse was a few weeks ago. No bleeding. + fetal movement. She has not taken any medication for the pain.   OB History    Gravida  1   Para      Term      Preterm      AB      Living  0     SAB      TAB      Ectopic      Multiple      Live Births              Past Medical History:  Diagnosis Date  . ADHD   . ADHD   . Medical history non-contributory     Past Surgical History:  Procedure Laterality Date  . NO PAST SURGERIES      Family History  Problem Relation Age of Onset  . Heart disease Father   . Heart disease Maternal Grandmother   . Heart disease Maternal Grandfather     Social History   Tobacco Use  . Smoking status: Current Every Day Smoker    Packs/day: 1.00    Types: Cigarettes  . Smokeless tobacco: Never Used  Substance Use Topics  . Alcohol use: Yes    Alcohol/week: 12.0 standard drinks    Types: 12 Cans of beer per week    Comment: 1-2 beer q other day, glass a wine 3 weeks ago.  . Drug use: Yes    Types: Marijuana    Comment: last smoke 1 week ago    Allergies: No Known Allergies  Medications Prior to Admission  Medication Sig Dispense Refill Last Dose  . cetirizine (ZYRTEC ALLERGY) 10 MG tablet Take 1 tablet (10 mg total) by mouth daily. (Patient not taking: Reported on 04/29/2018) 30 tablet 1 Not Taking  . Doxylamine-Pyridoxine (DICLEGIS) 10-10 MG TBEC Take 1 tablet with breakfast and lunch.  Take 2 tablets at bedtime.  (Patient not taking: Reported on 04/29/2018) 100 tablet 4 Not Taking  . metroNIDAZOLE (METROGEL VAGINAL) 0.75 % vaginal gel Place 1 Applicatorful vaginally 2 (two) times daily. (Patient not taking: Reported on 06/04/2018) 70 g 0 Not Taking  . prenatal vitamin w/FE, FA (PRENATAL 1 + 1) 27-1 MG TABS tablet Take 1 tablet by mouth daily at 12 noon.   Not Taking  . Prenatal-Fe Fum-Methf-FA w/o A (VITAFOL-NANO) 18-0.6-0.4 MG TABS Take 1 tablet by mouth daily. (Patient not taking: Reported on 06/04/2018) 30 tablet 12 Not Taking  . Vitamin D, Ergocalciferol, (DRISDOL) 50000 units CAPS capsule Take 1 capsule (50,000 Units total) by mouth every 7 (seven) days. (Patient not taking: Reported on 06/04/2018) 30 capsule 2 Not Taking   Recent Results (from the past 2160 hour(s))  Urinalysis, Routine w reflex microscopic     Status: Abnormal   Collection Time: 08/05/18  2:10 PM  Result Value Ref Range   Color, Urine YELLOW YELLOW   APPearance HAZY (A) CLEAR  Specific Gravity, Urine 1.025 1.005 - 1.030   pH 5.0 5.0 - 8.0   Glucose, UA NEGATIVE NEGATIVE mg/dL   Hgb urine dipstick NEGATIVE NEGATIVE   Bilirubin Urine NEGATIVE NEGATIVE   Ketones, ur NEGATIVE NEGATIVE mg/dL   Protein, ur NEGATIVE NEGATIVE mg/dL   Nitrite NEGATIVE NEGATIVE   Leukocytes, UA NEGATIVE NEGATIVE    Comment: Performed at [facility], [address], [place], [place] [address]  Wet prep, genital     Status: Abnormal   Collection Time: 08/05/18  2:34 PM  Result Value Ref Range   Yeast Wet Prep HPF POC PRESENT (A) NONE SEEN   Trich, Wet Prep NONE SEEN NONE SEEN   Clue Cells Wet Prep HPF POC NONE SEEN NONE SEEN   WBC, Wet Prep HPF POC FEW (A) NONE SEEN    Comment: MODERATE BACTERIA SEEN   Sperm NONE SEEN     Comment: Performed at [facility], [address] [place], [place] [address]  Fetal fibronectin     Status: None   Collection Time: 08/05/18  2:35 PM  Result Value Ref Range   Fetal Fibronectin NEGATIVE NEGATIVE     Comment: Performed at [facility], [address], [place], [place] [address]    Review of Systems  Constitutional: Negative for fever.  Gastrointestinal: Positive for abdominal pain.  Genitourinary: Positive for pelvic pain. Negative for vaginal bleeding and vaginal discharge.   Physical Exam   Blood pressure 123/70, pulse 81, temperature 98.1 F (36.7 C), temperature source Oral, resp. rate 18, weight 59.6 kg, last menstrual period 01/18/2018, SpO2 100 %.  Physical Exam  Constitutional: She is oriented to person, place, and time. She appears well-developed and well-nourished. No distress.  HENT:  Head: Normocephalic.  Eyes: Pupils are equal, round, and reactive to light.  GI: Soft. She exhibits no distension. There is no tenderness. There is no rebound.  Genitourinary:  Genitourinary Comments: Vagina - Small amount of white vaginal discharge, no odor  Cervix - No contact bleeding, no active bleeding  Bimanual exam: Cervix: Dilation: Fingertip Effacement (%): Thick Station: -3 Exam by:: Rasch,NP GC/Chlam, wet prep done, FFN collected  Chaperone present for exam.   Musculoskeletal: Normal range of motion.  Neurological: She is alert and oriented to person, place, and time.  Skin: Skin is warm. She is not diaphoretic.  Psychiatric: Her behavior is normal.    Fetal Tracing: Baseline: 140 bpm Variability: Moderate  Accelerations: 15x15 Decelerations: None Toco: Irregular pattern initially, none following procardia   MAU Course  Procedures  None  MDM  LR bolus X 1 Procardia X 3 doses FFN negative Wet prep/GC  Patient feeling much better, cervix unchanged.   Assessment and Plan   A:  1. Braxton Hicks contractions   2. Threatened premature labor in third trimester   3. Yeast vaginitis     P:  Discharge home in stable condition Pelvic rest Rx: Terazol, procardia Return to MAU if symptoms worsen Preterm labor precautions   Rasch, Harolyn Rutherford,  NP 08/05/2018 5:07 PM

## 2018-08-05 NOTE — Discharge Instructions (Signed)
Pelvic Rest °Pelvic rest may be recommended if: °· Your placenta is partially or completely covering the opening of your cervix (placenta previa). °· There is bleeding between the wall of the uterus and the amniotic sac in the first trimester of pregnancy (subchorionic hemorrhage). °· You went into labor too early (preterm labor). ° °Based on your overall health and the health of your baby, your health care provider will decide if pelvic rest is right for you. °How do I rest my pelvis? °For as long as told by your health care provider: °· Do not have sex, sexual stimulation, or an orgasm. °· Do not use tampons. Do not douche. Do not put anything in your vagina. °· Do not lift anything that is heavier than 10 lb (4.5 kg). °· Avoid activities that take a lot of effort (are strenuous). °· Avoid any activity in which your pelvic muscles could become strained. ° °When should I seek medical care? °Seek medical care if you have: °· Cramping pain in your lower abdomen. °· Vaginal discharge. °· A low, dull backache. °· Regular contractions. °· Uterine tightening. ° °When should I seek immediate medical care? °Seek immediate medical care if: °· You have vaginal bleeding and you are pregnant. ° °This information is not intended to replace advice given to you by your health care provider. Make sure you discuss any questions you have with your health care provider. °Document Released: 03/03/2011 Document Revised: 04/13/2016 Document Reviewed: 05/10/2015 °Elsevier Interactive Patient Education © 2018 Elsevier Inc. ° ° °Preterm Labor and Birth Information °Pregnancy normally lasts 39-41 weeks. Preterm labor is when labor starts early. It starts before you have been pregnant for 37 whole weeks. °What are the risk factors for preterm labor? °Preterm labor is more likely to occur in women who: °· Have an infection while pregnant. °· Have a cervix that is short. °· Have gone into preterm labor before. °· Have had surgery on their  cervix. °· Are younger than age 17. °· Are older than age 35. °· Are African American. °· Are pregnant with two or more babies. °· Take street drugs while pregnant. °· Smoke while pregnant. °· Do not gain enough weight while pregnant. °· Got pregnant right after another pregnancy. ° °What are the symptoms of preterm labor? °Symptoms of preterm labor include: °· Cramps. The cramps may feel like the cramps some women get during their period. The cramps may happen with watery poop (diarrhea). °· Pain in the belly (abdomen). °· Pain in the lower back. °· Regular contractions or tightening. It may feel like your belly is getting tighter. °· Pressure in the lower belly that seems to get stronger. °· More fluid (discharge) leaking from the vagina. The fluid may be watery or bloody. °· Water breaking. ° °Why is it important to notice signs of preterm labor? °Babies who are born early may not be fully developed. They have a higher chance for: °· Long-term heart problems. °· Long-term lung problems. °· Trouble controlling body systems, like breathing. °· Bleeding in the brain. °· A condition called cerebral palsy. °· Learning difficulties. °· Death. ° °These risks are highest for babies who are born before 34 weeks of pregnancy. °How is preterm labor treated? °Treatment depends on: °· How long you were pregnant. °· Your condition. °· The health of your baby. ° °Treatment may involve: °· Having a stitch (suture) placed in your cervix. When you give birth, your cervix opens so the baby can come out. The stitch keeps   the cervix from opening too soon. °· Staying at the hospital. °· Taking or getting medicines, such as: °? Hormone medicines. °? Medicines to stop contractions. °? Medicines to help the baby’s lungs develop. °? Medicines to prevent your baby from having cerebral palsy. ° °What should I do if I am in preterm labor? °If you think you are going into labor too soon, call your doctor right away. °How can I prevent preterm  labor? °· Do not use any tobacco products. °? Examples of these are cigarettes, chewing tobacco, and e-cigarettes. °? If you need help quitting, ask your doctor. °· Do not use street drugs. °· Do not use any medicines unless you ask your doctor if they are safe for you. °· Talk with your doctor before taking any herbal supplements. °· Make sure you gain enough weight. °· Watch for infection. If you think you might have an infection, get it checked right away. °· If you have gone into preterm labor before, tell your doctor. °This information is not intended to replace advice given to you by your health care provider. Make sure you discuss any questions you have with your health care provider. °Document Released: 02/02/2009 Document Revised: 04/18/2016 Document Reviewed: 03/29/2016 °Elsevier Interactive Patient Education © 2018 Elsevier Inc. ° °

## 2018-08-05 NOTE — MAU Note (Signed)
Having pain.  Been going on for about a wk, but worse today. Coming every few minutes, only hurts on rt side. No bleeding, no water leaking.

## 2018-08-06 LAB — GC/CHLAMYDIA PROBE AMP (~~LOC~~) NOT AT ARMC
Chlamydia: NEGATIVE
Neisseria Gonorrhea: NEGATIVE

## 2018-08-08 ENCOUNTER — Encounter: Payer: Self-pay | Admitting: Obstetrics and Gynecology

## 2018-08-08 ENCOUNTER — Ambulatory Visit (INDEPENDENT_AMBULATORY_CARE_PROVIDER_SITE_OTHER): Payer: Medicaid Other | Admitting: Obstetrics and Gynecology

## 2018-08-08 VITALS — BP 117/68 | HR 101 | Wt 137.3 lb

## 2018-08-08 DIAGNOSIS — Z3400 Encounter for supervision of normal first pregnancy, unspecified trimester: Secondary | ICD-10-CM

## 2018-08-08 DIAGNOSIS — O479 False labor, unspecified: Secondary | ICD-10-CM

## 2018-08-08 DIAGNOSIS — O4700 False labor before 37 completed weeks of gestation, unspecified trimester: Secondary | ICD-10-CM | POA: Insufficient documentation

## 2018-08-08 MED ORDER — PREPLUS 27-1 MG PO TABS: 1.0000 | ORAL_TABLET | Freq: Every day | ORAL | 13 refills | Status: DC

## 2018-08-08 NOTE — Progress Notes (Signed)
Subjective:  Monique Hoffman is a 37 y.o. G1P0 at 5112w6d being seen today for ongoing prenatal care.  She is currently monitored for the following issues for this high-risk pregnancy and has Supervision of normal first pregnancy, antepartum; Family history of sickle cell trait in father; Alcohol abuse affecting pregnancy in first trimester; Vitamin D deficiency; and Preterm contractions on their problem list.  Patient reports no complaints.  Contractions: Irregular. Vag. Bleeding: None.  Movement: Present. Denies leaking of fluid.   The following portions of the patient's history were reviewed and updated as appropriate: allergies, current medications, past family history, past medical history, past social history, past surgical history and problem list. Problem list updated.  Objective:   Vitals:   08/08/18 1511  BP: 117/68  Pulse: (!) 101  Weight: 137 lb 4.8 oz (62.3 kg)    Fetal Status: Fetal Heart Rate (bpm): 148   Movement: Present     General:  Alert, oriented and cooperative. Patient is in no acute distress.  Skin: Skin is warm and dry. No rash noted.   Cardiovascular: Normal heart rate noted  Respiratory: Normal respiratory effort, no problems with respiration noted  Abdomen: Soft, gravid, appropriate for gestational age. Pain/Pressure: Absent     Pelvic:  Cervical exam deferred        Extremities: Normal range of motion.  Edema: Trace  Mental Status: Normal mood and affect. Normal behavior. Normal judgment and thought content.   Urinalysis:      Assessment and Plan:  Pregnancy: G1P0 at 3312w6d  1. Supervision of normal first pregnancy, antepartum Stable Has missed several appts d/t to taking care of mother. Sisiter is now available to help Discussed need for Glucola test ASAP. Decline Flu vaccine F/U Growth scan end of the month - Prenatal Vit-Fe Fumarate-FA (PREPLUS) 27-1 MG TABS; Take 1 tablet by mouth daily.  Dispense: 30 tablet; Refill: 13  2. Preterm  contractions Was seen in MAU a few days ago. FFN negative. Procardia x 3 doses. Has had no further problems. Advised to use Procardia PRN ut ctx  Preterm labor symptoms and general obstetric precautions including but not limited to vaginal bleeding, contractions, leaking of fluid and fetal movement were reviewed in detail with the patient. Please refer to After Visit Summary for other counseling recommendations.  Return in about 2 weeks (around 08/22/2018) for OB visit.   Hermina StaggersErvin, Stewart Sasaki L, MD

## 2018-08-08 NOTE — Patient Instructions (Signed)
Third Trimester of Pregnancy The third trimester is from week 28 through week 40 (months 7 through 9). The third trimester is a time when the unborn baby (fetus) is growing rapidly. At the end of the ninth month, the fetus is about 20 inches in length and weighs 6-10 pounds. Body changes during your third trimester Your body will continue to go through many changes during pregnancy. The changes vary from woman to woman. During the third trimester:  Your weight will continue to increase. You can expect to gain 25-35 pounds (11-16 kg) by the end of the pregnancy.  You may begin to get stretch marks on your hips, abdomen, and breasts.  You may urinate more often because the fetus is moving lower into your pelvis and pressing on your bladder.  You may develop or continue to have heartburn. This is caused by increased hormones that slow down muscles in the digestive tract.  You may develop or continue to have constipation because increased hormones slow digestion and cause the muscles that push waste through your intestines to relax.  You may develop hemorrhoids. These are swollen veins (varicose veins) in the rectum that can itch or be painful.  You may develop swollen, bulging veins (varicose veins) in your legs.  You may have increased body aches in the pelvis, back, or thighs. This is due to weight gain and increased hormones that are relaxing your joints.  You may have changes in your hair. These can include thickening of your hair, rapid growth, and changes in texture. Some women also have hair loss during or after pregnancy, or hair that feels dry or thin. Your hair will most likely return to normal after your baby is born.  Your breasts will continue to grow and they will continue to become tender. A yellow fluid (colostrum) may leak from your breasts. This is the first milk you are producing for your baby.  Your belly button may stick out.  You may notice more swelling in your hands,  face, or ankles.  You may have increased tingling or numbness in your hands, arms, and legs. The skin on your belly may also feel numb.  You may feel short of breath because of your expanding uterus.  You may have more problems sleeping. This can be caused by the size of your belly, increased need to urinate, and an increase in your body's metabolism.  You may notice the fetus "dropping," or moving lower in your abdomen (lightening).  You may have increased vaginal discharge.  You may notice your joints feel loose and you may have pain around your pelvic bone.  What to expect at prenatal visits You will have prenatal exams every 2 weeks until week 36. Then you will have weekly prenatal exams. During a routine prenatal visit:  You will be weighed to make sure you and the baby are growing normally.  Your blood pressure will be taken.  Your abdomen will be measured to track your baby's growth.  The fetal heartbeat will be listened to.  Any test results from the previous visit will be discussed.  You may have a cervical check near your due date to see if your cervix has softened or thinned (effaced).  You will be tested for Group B streptococcus. This happens between 35 and 37 weeks.  Your health care provider may ask you:  What your birth plan is.  How you are feeling.  If you are feeling the baby move.  If you have had   any abnormal symptoms, such as leaking fluid, bleeding, severe headaches, or abdominal cramping.  If you are using any tobacco products, including cigarettes, chewing tobacco, and electronic cigarettes.  If you have any questions.  Other tests or screenings that may be performed during your third trimester include:  Blood tests that check for low iron levels (anemia).  Fetal testing to check the health, activity level, and growth of the fetus. Testing is done if you have certain medical conditions or if there are problems during the  pregnancy.  Nonstress test (NST). This test checks the health of your baby to make sure there are no signs of problems, such as the baby not getting enough oxygen. During this test, a belt is placed around your belly. The baby is made to move, and its heart rate is monitored during movement.  What is false labor? False labor is a condition in which you feel small, irregular tightenings of the muscles in the womb (contractions) that usually go away with rest, changing position, or drinking water. These are called Braxton Hicks contractions. Contractions may last for hours, days, or even weeks before true labor sets in. If contractions come at regular intervals, become more frequent, increase in intensity, or become painful, you should see your health care provider. What are the signs of labor?  Abdominal cramps.  Regular contractions that start at 10 minutes apart and become stronger and more frequent with time.  Contractions that start on the top of the uterus and spread down to the lower abdomen and back.  Increased pelvic pressure and dull back pain.  A watery or bloody mucus discharge that comes from the vagina.  Leaking of amniotic fluid. This is also known as your "water breaking." It could be a slow trickle or a gush. Let your health care provider know if it has a color or strange odor. If you have any of these signs, call your health care provider right away, even if it is before your due date. Follow these instructions at home: Medicines  Follow your health care provider's instructions regarding medicine use. Specific medicines may be either safe or unsafe to take during pregnancy.  Take a prenatal vitamin that contains at least 600 micrograms (mcg) of folic acid.  If you develop constipation, try taking a stool softener if your health care provider approves. Eating and drinking  Eat a balanced diet that includes fresh fruits and vegetables, whole grains, good sources of protein  such as meat, eggs, or tofu, and low-fat dairy. Your health care provider will help you determine the amount of weight gain that is right for you.  Avoid raw meat and uncooked cheese. These carry germs that can cause birth defects in the baby.  If you have low calcium intake from food, talk to your health care provider about whether you should take a daily calcium supplement.  Eat four or five small meals rather than three large meals a day.  Limit foods that are high in fat and processed sugars, such as fried and sweet foods.  To prevent constipation: ? Drink enough fluid to keep your urine clear or pale yellow. ? Eat foods that are high in fiber, such as fresh fruits and vegetables, whole grains, and beans. Activity  Exercise only as directed by your health care provider. Most women can continue their usual exercise routine during pregnancy. Try to exercise for 30 minutes at least 5 days a week. Stop exercising if you experience uterine contractions.  Avoid heavy   lifting.  Do not exercise in extreme heat or humidity, or at high altitudes.  Wear low-heel, comfortable shoes.  Practice good posture.  You may continue to have sex unless your health care provider tells you otherwise. Relieving pain and discomfort  Take frequent breaks and rest with your legs elevated if you have leg cramps or low back pain.  Take warm sitz baths to soothe any pain or discomfort caused by hemorrhoids. Use hemorrhoid cream if your health care provider approves.  Wear a good support bra to prevent discomfort from breast tenderness.  If you develop varicose veins: ? Wear support pantyhose or compression stockings as told by your healthcare provider. ? Elevate your feet for 15 minutes, 3-4 times a day. Prenatal care  Write down your questions. Take them to your prenatal visits.  Keep all your prenatal visits as told by your health care provider. This is important. Safety  Wear your seat belt at  all times when driving.  Make a list of emergency phone numbers, including numbers for family, friends, the hospital, and police and fire departments. General instructions  Avoid cat litter boxes and soil used by cats. These carry germs that can cause birth defects in the baby. If you have a cat, ask someone to clean the litter box for you.  Do not travel far distances unless it is absolutely necessary and only with the approval of your health care provider.  Do not use hot tubs, steam rooms, or saunas.  Do not drink alcohol.  Do not use any products that contain nicotine or tobacco, such as cigarettes and e-cigarettes. If you need help quitting, ask your health care provider.  Do not use any medicinal herbs or unprescribed drugs. These chemicals affect the formation and growth of the baby.  Do not douche or use tampons or scented sanitary pads.  Do not cross your legs for long periods of time.  To prepare for the arrival of your baby: ? Take prenatal classes to understand, practice, and ask questions about labor and delivery. ? Make a trial run to the hospital. ? Visit the hospital and tour the maternity area. ? Arrange for maternity or paternity leave through employers. ? Arrange for family and friends to take care of pets while you are in the hospital. ? Purchase a rear-facing car seat and make sure you know how to install it in your car. ? Pack your hospital bag. ? Prepare the baby's nursery. Make sure to remove all pillows and stuffed animals from the baby's crib to prevent suffocation.  Visit your dentist if you have not gone during your pregnancy. Use a soft toothbrush to brush your teeth and be gentle when you floss. Contact a health care provider if:  You are unsure if you are in labor or if your water has broken.  You become dizzy.  You have mild pelvic cramps, pelvic pressure, or nagging pain in your abdominal area.  You have lower back pain.  You have persistent  nausea, vomiting, or diarrhea.  You have an unusual or bad smelling vaginal discharge.  You have pain when you urinate. Get help right away if:  Your water breaks before 37 weeks.  You have regular contractions less than 5 minutes apart before 37 weeks.  You have a fever.  You are leaking fluid from your vagina.  You have spotting or bleeding from your vagina.  You have severe abdominal pain or cramping.  You have rapid weight loss or weight gain.    You have shortness of breath with chest pain.  You notice sudden or extreme swelling of your face, hands, ankles, feet, or legs.  Your baby makes fewer than 10 movements in 2 hours.  You have severe headaches that do not go away when you take medicine.  You have vision changes. Summary  The third trimester is from week 28 through week 40, months 7 through 9. The third trimester is a time when the unborn baby (fetus) is growing rapidly.  During the third trimester, your discomfort may increase as you and your baby continue to gain weight. You may have abdominal, leg, and back pain, sleeping problems, and an increased need to urinate.  During the third trimester your breasts will keep growing and they will continue to become tender. A yellow fluid (colostrum) may leak from your breasts. This is the first milk you are producing for your baby.  False labor is a condition in which you feel small, irregular tightenings of the muscles in the womb (contractions) that eventually go away. These are called Braxton Hicks contractions. Contractions may last for hours, days, or even weeks before true labor sets in.  Signs of labor can include: abdominal cramps; regular contractions that start at 10 minutes apart and become stronger and more frequent with time; watery or bloody mucus discharge that comes from the vagina; increased pelvic pressure and dull back pain; and leaking of amniotic fluid. This information is not intended to replace advice  given to you by your health care provider. Make sure you discuss any questions you have with your health care provider. Document Released: 10/31/2001 Document Revised: 04/13/2016 Document Reviewed: 01/07/2013 Elsevier Interactive Patient Education  2017 [employer]  

## 2018-08-08 NOTE — Progress Notes (Signed)
Pt is here for ROB G1P0 471w6d. Pt was at [facility] on Monday for contractions, pt sent home on procardia. Pt also diagnosed with yeast infection and has not started terazol yet.

## 2018-08-13 ENCOUNTER — Other Ambulatory Visit: Payer: Medicaid Other

## 2018-08-19 ENCOUNTER — Encounter (HOSPITAL_COMMUNITY): Payer: Self-pay

## 2018-08-19 ENCOUNTER — Ambulatory Visit (HOSPITAL_COMMUNITY)
Admission: RE | Admit: 2018-08-19 | Discharge: 2018-08-19 | Disposition: A | Discharge status: Home / Self Care | Payer: Medicaid Other | Source: Ambulatory Visit | Attending: Obstetrics and Gynecology | Admitting: Obstetrics and Gynecology

## 2018-08-19 DIAGNOSIS — O99312 Alcohol use complicating pregnancy, second trimester: Secondary | ICD-10-CM | POA: Diagnosis not present

## 2018-08-19 DIAGNOSIS — Z3A30 30 weeks gestation of pregnancy: Secondary | ICD-10-CM | POA: Insufficient documentation

## 2018-08-19 DIAGNOSIS — O99313 Alcohol use complicating pregnancy, third trimester: Secondary | ICD-10-CM | POA: Insufficient documentation

## 2018-08-19 DIAGNOSIS — O09523 Supervision of elderly multigravida, third trimester: Secondary | ICD-10-CM | POA: Diagnosis not present

## 2018-08-19 DIAGNOSIS — O99310 Alcohol use complicating pregnancy, unspecified trimester: Secondary | ICD-10-CM

## 2018-08-19 DIAGNOSIS — Z3400 Encounter for supervision of normal first pregnancy, unspecified trimester: Secondary | ICD-10-CM

## 2018-08-19 DIAGNOSIS — O09522 Supervision of elderly multigravida, second trimester: Secondary | ICD-10-CM

## 2018-08-19 DIAGNOSIS — Z362 Encounter for other antenatal screening follow-up: Secondary | ICD-10-CM | POA: Insufficient documentation

## 2018-08-20 ENCOUNTER — Other Ambulatory Visit (HOSPITAL_COMMUNITY): Payer: Self-pay | Admitting: *Deleted

## 2018-08-20 ENCOUNTER — Other Ambulatory Visit: Payer: Medicaid Other

## 2018-08-20 DIAGNOSIS — O09513 Supervision of elderly primigravida, third trimester: Secondary | ICD-10-CM

## 2018-08-21 ENCOUNTER — Other Ambulatory Visit: Payer: Medicaid Other

## 2018-08-22 ENCOUNTER — Ambulatory Visit (INDEPENDENT_AMBULATORY_CARE_PROVIDER_SITE_OTHER): Payer: Medicaid Other | Admitting: Obstetrics and Gynecology

## 2018-08-22 ENCOUNTER — Encounter: Payer: Self-pay | Admitting: Obstetrics and Gynecology

## 2018-08-22 VITALS — BP 112/77 | HR 83 | Wt 135.4 lb

## 2018-08-22 DIAGNOSIS — Z3403 Encounter for supervision of normal first pregnancy, third trimester: Secondary | ICD-10-CM

## 2018-08-22 DIAGNOSIS — Z23 Encounter for immunization: Secondary | ICD-10-CM

## 2018-08-22 DIAGNOSIS — O99311 Alcohol use complicating pregnancy, first trimester: Secondary | ICD-10-CM

## 2018-08-22 DIAGNOSIS — O99313 Alcohol use complicating pregnancy, third trimester: Secondary | ICD-10-CM

## 2018-08-22 DIAGNOSIS — Z3400 Encounter for supervision of normal first pregnancy, unspecified trimester: Secondary | ICD-10-CM

## 2018-08-22 DIAGNOSIS — F1010 Alcohol abuse, uncomplicated: Secondary | ICD-10-CM

## 2018-08-22 DIAGNOSIS — Z720 Tobacco use: Secondary | ICD-10-CM

## 2018-08-22 NOTE — Progress Notes (Signed)
Pt presents for ROB. 2 gtt scheduled 08/28/18. Pt agrees to TDAP today but requests to wait until [place] to get the Flu vaccine.   Pt refuses Terconazole txt for yeast infection; she said she prefers to eat yogurt.

## 2018-08-22 NOTE — Progress Notes (Signed)
   PRENATAL VISIT NOTE  Subjective:  Monique Hoffman is a 37 y.o. G1P0 at [redacted]w[redacted]d being seen today for ongoing prenatal care.  She is currently monitored for the following issues for this high-risk pregnancy and has Supervision of normal first pregnancy, antepartum; Family history of sickle cell trait in father; Alcohol abuse affecting pregnancy in first trimester; Vitamin D deficiency; and Preterm contractions on their problem list.  Patient reports no complaints.  Contractions: Irregular. Vag. Bleeding: None.  Movement: Present. Denies leaking of fluid.   The following portions of the patient's history were reviewed and updated as appropriate: allergies, current medications, past family history, past medical history, past social history, past surgical history and problem list. Problem list updated.  Objective:   Vitals:   08/22/18 1540  BP: 112/77  Pulse: 83  Weight: 135 lb 6.4 oz (61.4 kg)    Fetal Status: Fetal Heart Rate (bpm): 150   Movement: Present     General:  Alert, oriented and cooperative. Patient is in no acute distress.  Skin: Skin is warm and dry. No rash noted.   Cardiovascular: Normal heart rate noted  Respiratory: Normal respiratory effort, no problems with respiration noted  Abdomen: Soft, gravid, appropriate for gestational age.  Pain/Pressure: Absent     Pelvic: Cervical exam deferred        Extremities: Normal range of motion.  Edema: Trace  Mental Status: Normal mood and affect. Normal behavior. Normal judgment and thought content.   Assessment and Plan:  Pregnancy: G1P0 at [redacted]w[redacted]d  1. Supervision of normal first pregnancy, antepartum Rescheduled GTT, not able to do it today Tdap today Counseled regarding risks/benefits of flu vaccine, patient declines vaccine today, will consider.   2. Alcohol abuse affecting pregnancy in first trimester States she has stopped EtOH  3. Tobacco abuse Smoking 1/2 PPD Smoking and tobacco cessation was discussed at  today's visit for 4 minutes    Preterm labor symptoms and general obstetric precautions including but not limited to vaginal bleeding, contractions, leaking of fluid and fetal movement were reviewed in detail with the patient. Please refer to After Visit Summary for other counseling recommendations.  Return in about 2 weeks (around 09/05/2018) for OB visit, 2 hr GTT, 3rd trim labs.  Future Appointments  Date Time Provider Department Center  08/28/2018  8:15 AM [facility] None  09/16/2018  3:45 PM [facility] [place] 2 [facility]    Conan Bowens, MD

## 2018-08-28 ENCOUNTER — Other Ambulatory Visit: Payer: Medicaid Other

## 2018-08-28 ENCOUNTER — Telehealth: Payer: Self-pay

## 2018-08-28 NOTE — Telephone Encounter (Signed)
Patient called and states she could not make it to her appointment today because her feet and legs are "swollen 3 times the size they normally are" and "she cannot put any pressure on them because they hurt bad". Pt states that the right leg is more swollen than the left. Pt denies any contractions or vaginal bleeding/abnormal discharge. Pt reports good fetal movement. Pt denies HA. Advised pt to go be evaluated at [facility], pt verbalized understanding.

## 2018-09-05 ENCOUNTER — Encounter: Payer: Medicaid Other | Admitting: Certified Nurse Midwife

## 2018-09-05 ENCOUNTER — Other Ambulatory Visit: Payer: Medicaid Other

## 2018-09-11 ENCOUNTER — Encounter: Payer: Medicaid Other | Admitting: Obstetrics and Gynecology

## 2018-09-11 ENCOUNTER — Other Ambulatory Visit: Payer: Medicaid Other

## 2018-09-16 ENCOUNTER — Ambulatory Visit (HOSPITAL_COMMUNITY)
Admission: RE | Admit: 2018-09-16 | Discharge: 2018-09-16 | Disposition: A | Discharge status: Home / Self Care | Source: Ambulatory Visit | Attending: Obstetrics and Gynecology | Admitting: Obstetrics and Gynecology

## 2018-09-16 ENCOUNTER — Encounter (HOSPITAL_COMMUNITY): Payer: Self-pay

## 2018-09-19 ENCOUNTER — Encounter: Payer: Self-pay | Admitting: Obstetrics and Gynecology

## 2018-09-26 ENCOUNTER — Other Ambulatory Visit: Payer: Medicaid Other

## 2018-10-03 ENCOUNTER — Encounter: Payer: Medicaid Other | Admitting: Advanced Practice Midwife

## 2018-10-10 ENCOUNTER — Other Ambulatory Visit (HOSPITAL_COMMUNITY)
Admission: RE | Admit: 2018-10-10 | Discharge: 2018-10-10 | Disposition: A | Discharge status: Home / Self Care | Payer: Medicaid Other | Source: Ambulatory Visit | Attending: Family Medicine | Admitting: Family Medicine

## 2018-10-10 ENCOUNTER — Ambulatory Visit (INDEPENDENT_AMBULATORY_CARE_PROVIDER_SITE_OTHER): Payer: Medicaid Other | Admitting: Family Medicine

## 2018-10-10 VITALS — BP 121/72 | HR 80 | Wt 137.4 lb

## 2018-10-10 DIAGNOSIS — E559 Vitamin D deficiency, unspecified: Secondary | ICD-10-CM

## 2018-10-10 DIAGNOSIS — Z3400 Encounter for supervision of normal first pregnancy, unspecified trimester: Secondary | ICD-10-CM

## 2018-10-10 DIAGNOSIS — F1010 Alcohol abuse, uncomplicated: Secondary | ICD-10-CM

## 2018-10-10 DIAGNOSIS — R040 Epistaxis: Secondary | ICD-10-CM

## 2018-10-10 DIAGNOSIS — Z720 Tobacco use: Secondary | ICD-10-CM

## 2018-10-10 DIAGNOSIS — O0933 Supervision of pregnancy with insufficient antenatal care, third trimester: Secondary | ICD-10-CM | POA: Insufficient documentation

## 2018-10-10 DIAGNOSIS — O99313 Alcohol use complicating pregnancy, third trimester: Secondary | ICD-10-CM

## 2018-10-10 DIAGNOSIS — O99311 Alcohol use complicating pregnancy, first trimester: Secondary | ICD-10-CM

## 2018-10-10 DIAGNOSIS — Z3403 Encounter for supervision of normal first pregnancy, third trimester: Secondary | ICD-10-CM

## 2018-10-10 MED ORDER — SALINE SPRAY 0.65 % NA SOLN: 1.0000 | NASAL | 0 refills | Status: AC | PRN

## 2018-10-10 MED ORDER — PREPLUS 27-1 MG PO TABS: 1.0000 | ORAL_TABLET | Freq: Every day | ORAL | 4 refills | Status: AC

## 2018-10-10 NOTE — Progress Notes (Signed)
   PRENATAL VISIT NOTE Subjective:  Monique Hoffman is a 37 y.o. G1P0 at 4758w6d being seen today for ongoing prenatal care.  She is currently monitored for the following issues for this low-risk pregnancy and has Supervision of normal first pregnancy, antepartum; Family history of sickle cell trait in father; Alcohol Use in Pregnancy ; Vitamin D deficiency; Preterm contractions; and Tobacco use on their problem list.  Nosebleeds - worse in last week or so - denies putting anything in nose - not a lot, usually in morning  Shortness of Breath - has worsened throughout pregnancy - still smoking, maybe more than before - states wasn't able to get nicotine patches while pregnant? - feels like needs to smoke because of less EtOH use   LBP, Loss of Mucous Plug - think lost mucous plug 2-3 nights ago  - no bleeding with it but "back cramps" every 7-8 minutes - was seen for preterm contractions, IVF and medication helped but never filled procardia    Contractions: Irregular. Vag. Bleeding: None.  Movement: Present. Denies leaking of fluid.   The following portions of the patient's history were reviewed and updated as appropriate: allergies, current medications, past family history, past medical history, past social history, past surgical history and problem list. Problem list updated.  Objective:   Vitals:   10/10/18 0910  BP: 121/72  Pulse: 80  Weight: 137 lb 6.4 oz (62.3 kg)    Fetal Status: Fetal Heart Rate (bpm): 150 Fundal Height: 35 cm Movement: Present     General:  Alert, oriented and cooperative. Patient is in no acute distress.  Skin: Skin is warm and dry. No rash noted.   Cardiovascular: Normal heart rate noted  Respiratory: Normal respiratory effort, no problems with respiration noted  Abdomen: Soft, gravid, appropriate for gestational age.  Pain/Pressure: Present     Pelvic: Cervical exam performed Dilation: 2 Effacement (%): 50 Station: -2  Extremities: Normal range  of motion.  Edema: Trace  Mental Status: Normal mood and affect. Normal behavior. Normal judgment and thought content.   Assessment and Plan:  Pregnancy: G1P0 at 3958w6d  1. Supervision of normal first pregnancy, antepartum -- prenatal record reviewed - has missed appointments for last 6-8 weeks, not had 28-week labs -- A1C ordered today, unable to stay for 2-hour because of work commitments and wouldn't be able to do until nearly 39 weeks -- CBC, HIV, RPR collected -- Strep Gp B NAA -- Cervicovaginal ancillary only( ) -- repeat cervical exam shows slight change in dilation - has lost mucous plug, labor precautions prescribed  -- advised to continue PNV for Vitamin D deficiency (mild at 17) - never started higher dose supplementation   2. Size < Dates  Tobacco Use -- has U/S scheduled next week -- counseled on decreased tobacco use - offered Rx of nicotine patches but declined   3. Epistaxis -- likely related to dry heat -- Rx for nasal saline  Term labor symptoms and general obstetric precautions including but not limited to vaginal bleeding, contractions, leaking of fluid and fetal movement were reviewed in detail with the patient. Please refer to After Visit Summary for other counseling recommendations.   Return in about 1 week (around 10/17/2018) for ROB.  Future Appointments  Date Time Provider Department Center  10/15/2018  2:45 PM [facility] [place] 2 [facility]  10/16/2018  2:15 PM Leftwich-Kirby, Wilmer FloorLisa A, CNM [facility] None   Tamera StandsLaurel S , DO

## 2018-10-10 NOTE — Progress Notes (Signed)
Patient reports good fetal movement with irregular contractions. Pt reports that she thinks she lost mucous plug.

## 2018-10-11 LAB — HIV ANTIBODY (ROUTINE TESTING W REFLEX): HIV SCREEN 4TH GENERATION: NONREACTIVE

## 2018-10-11 LAB — HEMOGLOBIN A1C
Est. average glucose Bld gHb Est-mCnc: 108 mg/dL
Hgb A1c MFr Bld: 5.4 % (ref 4.8–5.6)

## 2018-10-11 LAB — CERVICOVAGINAL ANCILLARY ONLY
Chlamydia: NEGATIVE
Neisseria Gonorrhea: NEGATIVE

## 2018-10-11 LAB — CBC
HEMATOCRIT: 38.5 % (ref 34.0–46.6)
HEMOGLOBIN: 12.7 g/dL (ref 11.1–15.9)
MCH: 28.5 pg (ref 26.6–33.0)
MCHC: 33 g/dL (ref 31.5–35.7)
MCV: 87 fL (ref 79–97)
Platelets: 182 10*3/uL (ref 150–450)
RBC: 4.45 x10E6/uL (ref 3.77–5.28)
RDW: 13.1 % (ref 12.3–15.4)
WBC: 9 10*3/uL (ref 3.4–10.8)

## 2018-10-11 LAB — RPR: RPR Ser Ql: NONREACTIVE

## 2018-10-12 LAB — STREP GP B NAA: Strep Gp B NAA: NEGATIVE

## 2018-10-15 ENCOUNTER — Encounter (HOSPITAL_COMMUNITY): Payer: Self-pay

## 2018-10-15 ENCOUNTER — Ambulatory Visit (HOSPITAL_COMMUNITY)
Admission: RE | Admit: 2018-10-15 | Discharge: 2018-10-15 | Disposition: A | Discharge status: Home / Self Care | Payer: Medicaid Other | Source: Ambulatory Visit | Attending: Obstetrics and Gynecology | Admitting: Obstetrics and Gynecology

## 2018-10-15 ENCOUNTER — Other Ambulatory Visit (HOSPITAL_COMMUNITY): Payer: Self-pay | Admitting: Obstetrics and Gynecology

## 2018-10-15 DIAGNOSIS — O09513 Supervision of elderly primigravida, third trimester: Secondary | ICD-10-CM | POA: Insufficient documentation

## 2018-10-15 DIAGNOSIS — O26843 Uterine size-date discrepancy, third trimester: Secondary | ICD-10-CM

## 2018-10-15 DIAGNOSIS — O09523 Supervision of elderly multigravida, third trimester: Secondary | ICD-10-CM

## 2018-10-15 DIAGNOSIS — Z3A38 38 weeks gestation of pregnancy: Secondary | ICD-10-CM | POA: Diagnosis not present

## 2018-10-15 DIAGNOSIS — O99313 Alcohol use complicating pregnancy, third trimester: Secondary | ICD-10-CM | POA: Diagnosis not present

## 2018-10-15 DIAGNOSIS — O99333 Smoking (tobacco) complicating pregnancy, third trimester: Secondary | ICD-10-CM | POA: Diagnosis not present

## 2018-10-15 DIAGNOSIS — Z3400 Encounter for supervision of normal first pregnancy, unspecified trimester: Secondary | ICD-10-CM

## 2018-10-16 ENCOUNTER — Ambulatory Visit (INDEPENDENT_AMBULATORY_CARE_PROVIDER_SITE_OTHER): Payer: Medicaid Other | Admitting: Advanced Practice Midwife

## 2018-10-16 VITALS — BP 124/80 | HR 102 | Wt 139.8 lb

## 2018-10-16 DIAGNOSIS — O0993 Supervision of high risk pregnancy, unspecified, third trimester: Secondary | ICD-10-CM

## 2018-10-16 DIAGNOSIS — O26893 Other specified pregnancy related conditions, third trimester: Secondary | ICD-10-CM

## 2018-10-16 DIAGNOSIS — N898 Other specified noninflammatory disorders of vagina: Secondary | ICD-10-CM

## 2018-10-16 DIAGNOSIS — O99311 Alcohol use complicating pregnancy, first trimester: Secondary | ICD-10-CM

## 2018-10-16 DIAGNOSIS — Z720 Tobacco use: Secondary | ICD-10-CM

## 2018-10-16 DIAGNOSIS — O99313 Alcohol use complicating pregnancy, third trimester: Secondary | ICD-10-CM

## 2018-10-16 DIAGNOSIS — Z3A38 38 weeks gestation of pregnancy: Secondary | ICD-10-CM

## 2018-10-16 DIAGNOSIS — F1010 Alcohol abuse, uncomplicated: Secondary | ICD-10-CM

## 2018-10-16 DIAGNOSIS — Z3400 Encounter for supervision of normal first pregnancy, unspecified trimester: Secondary | ICD-10-CM

## 2018-10-16 NOTE — Patient Instructions (Signed)

## 2018-10-16 NOTE — Progress Notes (Signed)
   PRENATAL VISIT NOTE  Subjective:  Monique Hoffman is a 37 y.o. G1P0 at 440w5d being seen today for ongoing prenatal care.  She is currently monitored for the following issues for this high-risk pregnancy and has Supervision of normal first pregnancy, antepartum; Family history of sickle cell trait in father; Alcohol Use in Pregnancy ; Vitamin D deficiency; Preterm contractions; Tobacco use; and Limited prenatal care in third trimester on their problem list.  Patient reports leaking/vaginal discharge.  Contractions: Not present. Vag. Bleeding: None.  Movement: Present. Denies leaking of fluid.   The following portions of the patient's history were reviewed and updated as appropriate: allergies, current medications, past family history, past medical history, past social history, past surgical history and problem list. Problem list updated.  Objective:   Vitals:   10/16/18 1432  BP: 124/80  Pulse: (!) 102  Weight: 63.4 kg    Fetal Status: Fetal Heart Rate (bpm): 152   Movement: Present     General:  Alert, oriented and cooperative. Patient is in no acute distress.  Skin: Skin is warm and dry. No rash noted.   Cardiovascular: Normal heart rate noted  Respiratory: Normal respiratory effort, no problems with respiration noted  Abdomen: Soft, gravid, appropriate for gestational age.  Pain/Pressure: Present     Pelvic: Cervical exam deferred        Extremities: Normal range of motion.  Edema: Trace  Mental Status: Normal mood and affect. Normal behavior. Normal judgment and thought content.   Assessment and Plan:  Pregnancy: G1P0 at 5940w5d  1. Supervision of high risk pregnancy in third trimester --Anticipatory guidance about next visits/weeks of pregnancy given.  2. Alcohol Use in Pregnancy  --Reports she is smoking more because she is not currently drinking.  3. Tobacco use --Pt declines nicotine patch  4. Vaginal discharge in pregnancy --Pt reports wetness in her  underwear, increases with coughing.  Not enough to require a pad. --Pooling and ferning negative on exam today.  Cervix visually 2-3 cm. --Labor signs reviewed/reasons to go to [facility].  Term labor symptoms and general obstetric precautions including but not limited to vaginal bleeding, contractions, leaking of fluid and fetal movement were reviewed in detail with the patient. Please refer to After Visit Summary for other counseling recommendations.  Return in about 1 week (around 10/23/2018).  No future appointments.  Sharen CounterLisa Leftwich-Kirby, CNM

## 2018-10-16 NOTE — Progress Notes (Signed)
Patient reports good fetal movement. Pt states that she has been leaking a little bit of fluid for the last 3 days.

## 2018-10-23 ENCOUNTER — Inpatient Hospital Stay (HOSPITAL_COMMUNITY)
Admission: AD | Admit: 2018-10-23 | Discharge: 2018-10-26 | DRG: 806 | Disposition: A | Discharge status: Home / Self Care | Payer: Medicaid Other | Attending: Obstetrics & Gynecology | Admitting: Obstetrics & Gynecology

## 2018-10-23 ENCOUNTER — Encounter (HOSPITAL_COMMUNITY): Payer: Self-pay | Admitting: *Deleted

## 2018-10-23 ENCOUNTER — Inpatient Hospital Stay (HOSPITAL_COMMUNITY): Payer: Medicaid Other | Admitting: Anesthesiology

## 2018-10-23 DIAGNOSIS — Z832 Family history of diseases of the blood and blood-forming organs and certain disorders involving the immune mechanism: Secondary | ICD-10-CM

## 2018-10-23 DIAGNOSIS — F17210 Nicotine dependence, cigarettes, uncomplicated: Secondary | ICD-10-CM | POA: Diagnosis present

## 2018-10-23 DIAGNOSIS — F1290 Cannabis use, unspecified, uncomplicated: Secondary | ICD-10-CM | POA: Diagnosis present

## 2018-10-23 DIAGNOSIS — O99334 Smoking (tobacco) complicating childbirth: Secondary | ICD-10-CM | POA: Diagnosis present

## 2018-10-23 DIAGNOSIS — Z720 Tobacco use: Secondary | ICD-10-CM | POA: Diagnosis present

## 2018-10-23 DIAGNOSIS — E559 Vitamin D deficiency, unspecified: Secondary | ICD-10-CM | POA: Diagnosis present

## 2018-10-23 DIAGNOSIS — F1010 Alcohol abuse, uncomplicated: Secondary | ICD-10-CM | POA: Diagnosis present

## 2018-10-23 DIAGNOSIS — O99324 Drug use complicating childbirth: Secondary | ICD-10-CM | POA: Diagnosis present

## 2018-10-23 DIAGNOSIS — Z3A39 39 weeks gestation of pregnancy: Secondary | ICD-10-CM

## 2018-10-23 DIAGNOSIS — Z3483 Encounter for supervision of other normal pregnancy, third trimester: Secondary | ICD-10-CM | POA: Diagnosis present

## 2018-10-23 DIAGNOSIS — O99311 Alcohol use complicating pregnancy, first trimester: Secondary | ICD-10-CM

## 2018-10-23 DIAGNOSIS — O0933 Supervision of pregnancy with insufficient antenatal care, third trimester: Secondary | ICD-10-CM

## 2018-10-23 DIAGNOSIS — Z3400 Encounter for supervision of normal first pregnancy, unspecified trimester: Secondary | ICD-10-CM

## 2018-10-23 LAB — RAPID URINE DRUG SCREEN, HOSP PERFORMED
Amphetamines: NOT DETECTED
Barbiturates: NOT DETECTED
Benzodiazepines: NOT DETECTED
Cocaine: NOT DETECTED
Opiates: NOT DETECTED
TETRAHYDROCANNABINOL: POSITIVE — AB

## 2018-10-23 LAB — CBC
HCT: 40.4 % (ref 36.0–46.0)
Hemoglobin: 13.2 g/dL (ref 12.0–15.0)
MCH: 28.6 pg (ref 26.0–34.0)
MCHC: 32.7 g/dL (ref 30.0–36.0)
MCV: 87.4 fL (ref 80.0–100.0)
Platelets: 285 10*3/uL (ref 150–400)
RBC: 4.62 MIL/uL (ref 3.87–5.11)
RDW: 14.5 % (ref 11.5–15.5)
WBC: 11.6 10*3/uL — ABNORMAL HIGH (ref 4.0–10.5)
nRBC: 0 % (ref 0.0–0.2)

## 2018-10-23 LAB — ABO/RH: ABO/RH(D): A POS

## 2018-10-23 LAB — TYPE AND SCREEN
ABO/RH(D): A POS
Antibody Screen: NEGATIVE

## 2018-10-23 MED ORDER — LIDOCAINE HCL (PF) 1 % IJ SOLN
INTRAMUSCULAR | Status: DC | PRN
  Administered 2018-10-23: 8 mL via EPIDURAL

## 2018-10-23 MED ORDER — LACTATED RINGERS IV SOLN: 500.0000 mL | INTRAVENOUS | Status: DC | PRN

## 2018-10-23 MED ORDER — ONDANSETRON HCL 4 MG/2ML IJ SOLN: 4.0000 mg | Freq: Four times a day (QID) | INTRAMUSCULAR | Status: DC | PRN

## 2018-10-23 MED ORDER — DIPHENHYDRAMINE HCL 50 MG/ML IJ SOLN: 12.5000 mg | INTRAMUSCULAR | Status: DC | PRN

## 2018-10-23 MED ORDER — SOD CITRATE-CITRIC ACID 500-334 MG/5ML PO SOLN: 30.0000 mL | ORAL | Status: DC | PRN

## 2018-10-23 MED ORDER — PHENYLEPHRINE 40 MCG/ML (10ML) SYRINGE FOR IV PUSH (FOR BLOOD PRESSURE SUPPORT)
80.0000 ug | PREFILLED_SYRINGE | INTRAVENOUS | Status: DC | PRN
  Filled 2018-10-23: qty 10
  Filled 2018-10-23: qty 5

## 2018-10-23 MED ORDER — OXYTOCIN BOLUS FROM INFUSION
500.0000 mL | Freq: Once | INTRAVENOUS | Status: AC
  Administered 2018-10-24: 500 mL via INTRAVENOUS

## 2018-10-23 MED ORDER — FENTANYL 2.5 MCG/ML BUPIVACAINE 1/10 % EPIDURAL INFUSION (WH - ANES)
14.0000 mL/h | INTRAMUSCULAR | Status: DC | PRN
  Administered 2018-10-23 (×2): 14 mL/h via EPIDURAL
  Filled 2018-10-23 (×2): qty 100

## 2018-10-23 MED ORDER — FENTANYL CITRATE (PF) 100 MCG/2ML IJ SOLN: 100.0000 ug | INTRAMUSCULAR | Status: DC | PRN

## 2018-10-23 MED ORDER — LIDOCAINE HCL (PF) 1 % IJ SOLN
30.0000 mL | INTRAMUSCULAR | Status: DC | PRN
  Filled 2018-10-23: qty 30

## 2018-10-23 MED ORDER — EPHEDRINE 5 MG/ML INJ
10.0000 mg | INTRAVENOUS | Status: DC | PRN
  Filled 2018-10-23: qty 2

## 2018-10-23 MED ORDER — LACTATED RINGERS IV SOLN
INTRAVENOUS | Status: DC
  Administered 2018-10-23 (×2): via INTRAVENOUS

## 2018-10-23 MED ORDER — PHENYLEPHRINE 40 MCG/ML (10ML) SYRINGE FOR IV PUSH (FOR BLOOD PRESSURE SUPPORT)
80.0000 ug | PREFILLED_SYRINGE | INTRAVENOUS | Status: DC | PRN
  Filled 2018-10-23: qty 5

## 2018-10-23 MED ORDER — OXYTOCIN 40 UNITS IN LACTATED RINGERS INFUSION - SIMPLE MED
2.5000 [IU]/h | INTRAVENOUS | Status: DC
  Administered 2018-10-24: 2.5 [IU]/h via INTRAVENOUS
  Filled 2018-10-23: qty 1000

## 2018-10-23 MED ORDER — LACTATED RINGERS IV SOLN: 500.0000 mL | Freq: Once | INTRAVENOUS | Status: DC

## 2018-10-23 MED ORDER — FLEET ENEMA 7-19 GM/118ML RE ENEM: 1.0000 | ENEMA | RECTAL | Status: DC | PRN

## 2018-10-23 NOTE — Progress Notes (Signed)
[facility]: Labor Progress Note  Subjective: Doing well, very comfortable with epidural in place. Lots of friends, partner in room.   Objective: BP 109/70   Pulse 68   Temp (!) 97.4 F (36.3 C) (Oral)   Resp 16   Ht 5\' 4"  (1.626 m)   Wt 63.5 kg   LMP 01/18/2018   SpO2 100%   BMI 24.03 kg/m  Gen: well-appearing, NAD  Dilation: 5.5 Effacement (%): 90 Cervical Position: Middle, Anterior Station: -2 Presentation: Vertex Exam by:: CGoodnight,RNC   Assessment and Plan: 37 y.o. G1P0 535w5d here with spontaneous onset of labor. Pregnancy also complicated by daily EtOH use (reports 2-3 drinks, no history of withdrawal seizures).   Labor: Active labor on admission. Expectant management. Will plan for AROM with next check. -- pain control: epidural -- PPH Risk: low  Fetal Well-Being: EFW 6-7lbs. Cephalic by sutures with prior checks.  -- Category I - continuous fetal monitoring  -- GBS negative   Pearse Shiffler S. Earlene PlaterWallace, DO OB/GYN Fellow, [facility]  7:40 PM

## 2018-10-23 NOTE — Anesthesia Preprocedure Evaluation (Signed)
Anesthesia Evaluation  Patient identified by MRN, date of birth, ID band Patient awake    Reviewed: Allergy & Precautions, H&P , NPO status , Patient's Chart, lab work & pertinent test results, reviewed documented beta blocker date and time   Airway Mallampati: II  TM Distance: >3 FB Neck ROM: full    Dental no notable dental hx.    Pulmonary neg pulmonary ROS, Current Smoker,    Pulmonary exam normal breath sounds clear to auscultation       Cardiovascular negative cardio ROS Normal cardiovascular exam Rhythm:regular Rate:Normal     Neuro/Psych negative neurological ROS  negative psych ROS   GI/Hepatic negative GI ROS, Neg liver ROS,   Endo/Other  negative endocrine ROS  Renal/GU negative Renal ROS  negative genitourinary   Musculoskeletal   Abdominal   Peds  Hematology negative hematology ROS (+)   Anesthesia Other Findings   Reproductive/Obstetrics (+) Pregnancy                             Anesthesia Physical Anesthesia Plan  ASA: II  Anesthesia Plan: Epidural   Post-op Pain Management:    Induction:   PONV Risk Score and Plan:   Airway Management Planned:   Additional Equipment:   Intra-op Plan:   Post-operative Plan:   Informed Consent: I have reviewed the patients History and Physical, chart, labs and discussed the procedure including the risks, benefits and alternatives for the proposed anesthesia with the patient or authorized representative who has indicated his/her understanding and acceptance.     Dental Advisory Given  Plan Discussed with:   Anesthesia Plan Comments: (Labs checked- platelets confirmed with RN in room. Fetal heart tracing, per RN, reported to be stable enough for sitting procedure. Discussed epidural, and patient consents to the procedure:  included risk of possible headache,backache, failed block, allergic reaction, and nerve injury. This  patient was asked if she had any questions or concerns before the procedure started.)        Anesthesia Quick Evaluation  

## 2018-10-23 NOTE — MAU Note (Signed)
Contractions, denies bleeding. ?leaking fluid since this am

## 2018-10-23 NOTE — Progress Notes (Signed)
LABOR PROGRESS NOTE  Monique Hoffman is a 37 y.o. G1P0 at 6037w5d  admitted for SOL  Subjective: Patient is resting comfortably and in good spirits. She is not feeling her contractions. She's concerned about her bloody show.   Objective: BP 128/62   Pulse 68   Temp (!) 97.5 F (36.4 C) (Axillary)   Resp 16   Ht 5\' 4"  (1.626 m)   Wt 63.5 kg   LMP 01/18/2018   SpO2 100%   BMI 24.03 kg/m  or  Vitals:   10/23/18 2000 10/23/18 2009 10/23/18 2031 10/23/18 2101  BP: 114/78  (!) 123/92 128/62  Pulse: 76  72 68  Resp: 16  16 16   Temp:  (!) 97.5 F (36.4 C)    TempSrc:  Axillary    SpO2:      Weight:      Height:        ~2120 Dilation: 7 Effacement: 90% Station: 0 Presentation: vertex FHT: baseline rate 135, moderate varibility, positive acel, negative decel Toco: 2-4 minutes moderate  Labs: Lab Results  Component Value Date   WBC 11.6 (H) 10/23/2018   HGB 13.2 10/23/2018   HCT 40.4 10/23/2018   MCV 87.4 10/23/2018   PLT 285 10/23/2018    Patient Active Problem List   Diagnosis Date Noted  . Indication for care in labor or delivery 10/23/2018  . Tobacco use 10/10/2018  . Limited prenatal care in third trimester 10/10/2018  . Preterm contractions 08/08/2018  . Vitamin D deficiency 04/05/2018  . Supervision of normal first pregnancy, antepartum 04/01/2018  . Family history of sickle cell trait in father 04/01/2018  . Alcohol Use in Pregnancy  04/01/2018    Assessment / Plan: 37 y.o. G1P0 at 6337w5d here for SOL. Patient is doing well.  Labor: AROM at ~2120. Not feeling contractions. Progressing well to 7, 90, 0 station. Fetal Wellbeing:  Category I Pain Control:  Epidural in place and adequate Anticipated MOD:  vaginal  Chelsey Anderson DO 10/23/2018, 9:26 PM

## 2018-10-23 NOTE — H&P (Addendum)
OBSTETRIC ADMISSION HISTORY AND PHYSICAL  Monique Hoffman is a 37 y.o. female G1P0 with IUP at 221w5d presenting for management of SOL. She reports +FMs. No LOF, VB, blurry vision, headaches, peripheral edema, or RUQ pain. She plans on formula feeding. She requests interval tubal for birth control.  Reports drinking 2-3 drinks per day through pregnancy. Denies a history of withdrawal. Greatest length of time without alcohol recently was 1 week during which she did not have tremors or seizure.  Dating: By L/19 --->  Estimated Date of Delivery: 10/25/18  Sono:   @[redacted]w[redacted]d , CWD, normal anatomy, cephalic presentation, 3041 g, 43%ile  Prenatal History/Complications: AMA Alcohol Use  Marijuana Use Tobacco Use  Past Medical History: Past Medical History:  Diagnosis Date  . ADHD   . ADHD   . Medical history non-contributory     Past Surgical History: Past Surgical History:  Procedure Laterality Date  . NO PAST SURGERIES      Obstetrical History: OB History    Gravida  1   Para      Term      Preterm      AB      Living  0     SAB      TAB      Ectopic      Multiple      Live Births              Social History: Social History   Socioeconomic History  . Marital status: Single    Spouse name: Not on file  . Number of children: Not on file  . Years of education: Not on file  . Highest education level: Not on file  Occupational History  . Not on file  Social Needs  . Financial resource strain: Not on file  . Food insecurity:    Worry: Not on file    Inability: Not on file  . Transportation needs:    Medical: Not on file    Non-medical: Not on file  Tobacco Use  . Smoking status: Current Every Day Smoker    Packs/day: 1.00    Types: Cigarettes  . Smokeless tobacco: Never Used  Substance and Sexual Activity  . Alcohol use: Yes    Alcohol/week: 12.0 standard drinks    Types: 12 Cans of beer per week    Comment: 1-2 beer q other day, glass a  wine 3 weeks ago.  . Drug use: Yes    Types: Marijuana    Comment: last smoke 1 week ago  . Sexual activity: Yes    Birth control/protection: None  Lifestyle  . Physical activity:    Days per week: Not on file    Minutes per session: Not on file  . Stress: Not on file  Relationships  . Social connections:    Talks on phone: Not on file    Gets together: Not on file    Attends religious service: Not on file    Active member of club or organization: Not on file    Attends meetings of clubs or organizations: Not on file    Relationship status: Not on file  Other Topics Concern  . Not on file  Social History Narrative  . Not on file    Family History: Family History  Problem Relation Age of Onset  . Heart disease Father   . Heart disease Maternal Grandmother   . Heart disease Maternal Grandfather     Allergies: No Known Allergies  Medications Prior to Admission  Medication Sig Dispense Refill Last Dose  . Prenatal Vit-Fe Fumarate-FA (PREPLUS) 27-1 MG TABS Take 1 tablet by mouth daily. (Patient not taking: Reported on 10/15/2018) 90 tablet 4 Not Taking  . sodium chloride (OCEAN) 0.65 % SOLN nasal spray Place 1 spray into both nostrils as needed for congestion. (Patient not taking: Reported on 10/15/2018) 1 Bottle 0 Not Taking     Review of Systems   All systems reviewed and negative except as stated in HPI  Blood pressure 125/79, pulse 64, temperature 98.2 F (36.8 C), resp. rate 19, last menstrual period 01/18/2018, SpO2 100 %. General appearance: alert, cooperative and mild distress Lungs: regular rate and effort Heart: regular rate  Abdomen: soft, non-tender, gravid  Extremities: Homans sign is negative, no sign of DVT Presentation: cephalic Fetal monitoringBaseline: 140s bpm, Variability: Good {> 6 bpm), Accelerations: Reactive and Decelerations: Absent Uterine activity: Present Q47min Dilation: 6 Effacement (%): 90 Station: -2 Exam by:: Weston,RN    Prenatal labs ABO, Rh: A/Positive/-- (05/13 1635) Antibody: Negative (05/13 1635) Rubella: 2.96 (05/13 1635) RPR: Non Reactive (11/21 1103)  HBsAg: Negative (05/13 1635)  HIV: Non Reactive (11/21 1103)  GBS: Negative (11/21 1047)  2 hr GTT - Limited PNC, 3rd tri a1c 5.4%  Prenatal Transfer Tool  Maternal Diabetes: No Genetic Screening: Normal Maternal Ultrasounds/Referrals: Normal Fetal Ultrasounds or other Referrals:  None Maternal Substance Abuse:  Yes:  Type: Smoker, Marijuana, Other: Alcohol Significant Maternal Medications:  None Significant Maternal Lab Results: None  No results found for this or any previous visit (from the past 24 hour(s)).  Patient Active Problem List   Diagnosis Date Noted  . Indication for care in labor or delivery 10/23/2018  . Tobacco use 10/10/2018  . Limited prenatal care in third trimester 10/10/2018  . Preterm contractions 08/08/2018  . Vitamin D deficiency 04/05/2018  . Supervision of normal first pregnancy, antepartum 04/01/2018  . Family history of sickle cell trait in father 04/01/2018  . Alcohol Use in Pregnancy  04/01/2018    Assessment: Monique Hoffman is a 37 y.o. G1P0 at [redacted]w[redacted]d here for SOL. No concern for risk of withdrawal at this time (PAWSS 3, average risk) given no history of complicated withdrawal. Will not initiate CIWA protocol.   1. Labor: Progressing. 2. FWB: Reactive, reassuring FHT. 3. Pain: Desires epidural and IV medication PRN. 4. GBS: Negative   Plan: 1. Labor: Progressing without augmentation. Will continue to monitor progress. Could consider AROM after epidural placement.  2. FWB: Continue to monitor. Vertex by sutures. Category I strip.  3. Pain: Epidural pending.  4. GBS: Negative 5. EtOH, THC Use in Pregnancy: UDS pending. Will consult SW postpartum.   Awilda Metro, MD  10/23/2018, 2:49 PM  Attestation: I have seen this patient and agree with the resident's documentation. I have examined them  separately, and we have discussed the plan of care.  Cristal Deer. Earlene Plater, DO OB/GYN Fellow

## 2018-10-23 NOTE — Progress Notes (Signed)
Pt has ordered broth and jello.  Will change positions after she has eaten.

## 2018-10-23 NOTE — Anesthesia Procedure Notes (Signed)
Epidural Patient location during procedure: OB Start time: 10/23/2018 3:58 PM End time: 10/23/2018 4:02 PM  Staffing Anesthesiologist: Bethena Midgetddono, Nekesha Font, MD  Preanesthetic Checklist Completed: patient identified, site marked, surgical consent, pre-op evaluation, timeout performed, IV checked, risks and benefits discussed and monitors and equipment checked  Epidural Patient position: sitting Prep: site prepped and draped and DuraPrep Patient monitoring: continuous pulse ox and blood pressure Approach: midline Location: L3-L4 Injection technique: LOR air  Needle:  Needle type: Tuohy  Needle gauge: 17 G Needle length: 9 cm and 9 Needle insertion depth: 5 cm cm Catheter type: closed end flexible Catheter size: 19 Gauge Catheter at skin depth: 10 cm Test dose: negative  Assessment Events: blood not aspirated, injection not painful, no injection resistance, negative IV test and no paresthesia

## 2018-10-24 ENCOUNTER — Encounter (HOSPITAL_COMMUNITY): Payer: Self-pay

## 2018-10-24 ENCOUNTER — Encounter: Payer: Medicaid Other | Admitting: Certified Nurse Midwife

## 2018-10-24 ENCOUNTER — Other Ambulatory Visit: Payer: Self-pay

## 2018-10-24 DIAGNOSIS — O99324 Drug use complicating childbirth: Secondary | ICD-10-CM

## 2018-10-24 DIAGNOSIS — Z3A39 39 weeks gestation of pregnancy: Secondary | ICD-10-CM

## 2018-10-24 LAB — CBC
HCT: 35.8 % — ABNORMAL LOW (ref 36.0–46.0)
Hemoglobin: 11.9 g/dL — ABNORMAL LOW (ref 12.0–15.0)
MCH: 29 pg (ref 26.0–34.0)
MCHC: 33.2 g/dL (ref 30.0–36.0)
MCV: 87.3 fL (ref 80.0–100.0)
Platelets: 267 10*3/uL (ref 150–400)
RBC: 4.1 MIL/uL (ref 3.87–5.11)
RDW: 14.4 % (ref 11.5–15.5)
WBC: 17.4 10*3/uL — ABNORMAL HIGH (ref 4.0–10.5)
nRBC: 0 % (ref 0.0–0.2)

## 2018-10-24 LAB — RPR: RPR Ser Ql: NONREACTIVE

## 2018-10-24 MED ORDER — ONDANSETRON HCL 4 MG PO TABS: 4.0000 mg | ORAL_TABLET | ORAL | Status: DC | PRN

## 2018-10-24 MED ORDER — SENNOSIDES-DOCUSATE SODIUM 8.6-50 MG PO TABS
2.0000 | ORAL_TABLET | ORAL | Status: DC
  Administered 2018-10-25 – 2018-10-26 (×2): 2 via ORAL
  Filled 2018-10-24 (×2): qty 2

## 2018-10-24 MED ORDER — SIMETHICONE 80 MG PO CHEW: 80.0000 mg | CHEWABLE_TABLET | ORAL | Status: DC | PRN

## 2018-10-24 MED ORDER — COCONUT OIL OIL: 1.0000 "application " | TOPICAL_OIL | Status: DC | PRN

## 2018-10-24 MED ORDER — BENZOCAINE-MENTHOL 20-0.5 % EX AERO
1.0000 "application " | INHALATION_SPRAY | CUTANEOUS | Status: DC | PRN
  Administered 2018-10-24 – 2018-10-26 (×2): 1 via TOPICAL
  Filled 2018-10-24 (×3): qty 56

## 2018-10-24 MED ORDER — IBUPROFEN 600 MG PO TABS
600.0000 mg | ORAL_TABLET | Freq: Four times a day (QID) | ORAL | Status: DC
  Administered 2018-10-24 – 2018-10-26 (×7): 600 mg via ORAL
  Filled 2018-10-24 (×10): qty 1

## 2018-10-24 MED ORDER — WITCH HAZEL-GLYCERIN EX PADS: 1.0000 "application " | MEDICATED_PAD | CUTANEOUS | Status: DC | PRN

## 2018-10-24 MED ORDER — PRENATAL MULTIVITAMIN CH
1.0000 | ORAL_TABLET | Freq: Every day | ORAL | Status: DC
  Administered 2018-10-24 – 2018-10-25 (×2): 1 via ORAL
  Filled 2018-10-24 (×3): qty 1

## 2018-10-24 MED ORDER — ACETAMINOPHEN 325 MG PO TABS: 650.0000 mg | ORAL_TABLET | ORAL | Status: DC | PRN

## 2018-10-24 MED ORDER — ONDANSETRON HCL 4 MG/2ML IJ SOLN: 4.0000 mg | INTRAMUSCULAR | Status: DC | PRN

## 2018-10-24 MED ORDER — ZOLPIDEM TARTRATE 5 MG PO TABS: 5.0000 mg | ORAL_TABLET | Freq: Every evening | ORAL | Status: DC | PRN

## 2018-10-24 MED ORDER — TETANUS-DIPHTH-ACELL PERTUSSIS 5-2.5-18.5 LF-MCG/0.5 IM SUSP: 0.5000 mL | Freq: Once | INTRAMUSCULAR | Status: DC

## 2018-10-24 MED ORDER — ERYTHROMYCIN 5 MG/GM OP OINT
TOPICAL_OINTMENT | OPHTHALMIC | Status: AC
  Filled 2018-10-24: qty 1

## 2018-10-24 MED ORDER — DIBUCAINE 1 % RE OINT: 1.0000 "application " | TOPICAL_OINTMENT | RECTAL | Status: DC | PRN

## 2018-10-24 MED ORDER — DIPHENHYDRAMINE HCL 25 MG PO CAPS: 25.0000 mg | ORAL_CAPSULE | Freq: Four times a day (QID) | ORAL | Status: DC | PRN

## 2018-10-24 NOTE — Progress Notes (Addendum)
Patient was not in room at 0545, Patient left room to smoke a cigarette, despite nurse's advice

## 2018-10-24 NOTE — Anesthesia Postprocedure Evaluation (Signed)
Anesthesia Post Note  Patient: Monique Hoffman  Procedure(s) Performed: AN AD HOC LABOR EPIDURAL     Patient location during evaluation: Mother Baby Anesthesia Type: Epidural Level of consciousness: awake Pain management: pain level controlled Vital Signs Assessment: post-procedure vital signs reviewed and stable Respiratory status: spontaneous breathing Cardiovascular status: stable Postop Assessment: patient able to bend at knees, epidural receding, no backache and no headache Anesthetic complications: no    Last Vitals:  Vitals:   10/24/18 0359 10/24/18 0935  BP: 106/64 110/71  Pulse: 61 83  Resp:  18  Temp: 36.7 C 37 C  SpO2: 100%     Last Pain:  Vitals:   10/24/18 1156  TempSrc:   PainSc: 2    Pain Goal: Patients Stated Pain Goal: 4 (10/23/18 1455)               Edison PaceWILKERSON,[name]

## 2018-10-24 NOTE — Progress Notes (Signed)
Patient ID: Monique Hoffman, female   DOB: 06/20/1981, 37 y.o.   MRN: [id number] Doing well, starting to feel pressure per RN report Vitals:   10/23/18 2201 10/23/18 2231 10/23/18 2301 10/23/18 2331  BP: 116/79 124/70 105/74 109/63  Pulse: 73 75 63 68  Resp: 16 16 16 18   Temp: 97.8 F (36.6 C)     TempSrc: Axillary     SpO2:      Weight:      Height:       FHR reactive with good variability and accels UCs irregular every 2 min  Dilation: Lip/rim Effacement (%): 100 Cervical Position: Anterior Station: Plus 1 Presentation: Vertex Exam by:: B. Bumgarner, RN  Will anticipate second stage soon

## 2018-10-25 ENCOUNTER — Encounter (HOSPITAL_COMMUNITY): Payer: Self-pay

## 2018-10-25 MED ORDER — SENNOSIDES-DOCUSATE SODIUM 8.6-50 MG PO TABS: 2.0000 | ORAL_TABLET | ORAL | 0 refills | Status: AC

## 2018-10-25 MED ORDER — IBUPROFEN 600 MG PO TABS: 600.0000 mg | ORAL_TABLET | Freq: Four times a day (QID) | ORAL | 0 refills | Status: AC

## 2018-10-25 NOTE — Progress Notes (Signed)
CLINICAL SOCIAL WORK MATERNAL/CHILD NOTE  Patient Details  Name: Monique Hoffman MRN: [id number] Date of Birth: 10/24/2016  Date:  10/25/2018  Clinical Social Worker Initiating Note:  Jaylon Grode, LCSWA Date/Time: Initiated:  10/25/18/1138     Child's Name:  Monique Hoffman   Biological Parents:  Mother, Father(Father - Dorian Hoffman (02-02-78))   Need for Interpreter:  None   Reason for Referral:  Current Substance Use/Substance Use During Pregnancy    Address:  [address] [place] [address]    Phone number:  [phone] (home)     Additional phone number:   Household Members/Support Persons (HM/SP):   Household Member/Support Person 1   HM/SP Name Relationship DOB or Age  HM/SP -1 Dorian Hoffman FOB 02-02-78  HM/SP -2        HM/SP -3        HM/SP -4        HM/SP -5        HM/SP -6        HM/SP -7        HM/SP -8          Natural Supports (not living in the home):  Extended Family   Professional Supports: None   Employment: Full-time   Type of Work: [employer]    Education:  Other (comment)(GED)   Homebound arranged:    Financial Resources:  Self-Pay    Other Resources:  WIC, Food Stamps    Cultural/Religious Considerations Which May Impact Care:    Strengths:  Ability to meet basic needs , Home prepared for child    Psychotropic Medications:         Pediatrician:       Pediatrician List: Patient reported that she was provided with list and is currently looking for a pediatrician.   [place]    [place]    [place]    [place]    [place]    [place]      Pediatrician Fax Number:    Risk Factors/Current Problems:  Substance Use    Cognitive State:  Able to Concentrate , Alert , Linear Thinking    Mood/Affect:  Interested , Agitated    CSW Assessment: CSW spoke with MOB at bedside regarding consult for substance use, FOB present and asleep on the couch. MOB granted CSW  verbal permission to speak in front of FOB about anything. MOB was welcoming and engaged throughout assessment. CSW introduced self and explained role. CSW inquired about MOB's household, MOB reported that she resides with FOB and her mother. MOB reported that this is her first baby and she has everything she needs for the baby. CSW asked MOB who were her supports, MOB reported that she had a lot of family locally that are supportive.   MOB had a visitor arrive and granted CSW verbal permission to speak in front of her visitor about anything.   CSW inquired about MOB's mental health history, MOB reported that she was diagnosed with ADHD when she was younger and denies any current mental health signs/symptoms. MOB presented calm initially and became agigtated when talking about baby being swaddled by nursing staff while she was asleep. MOB reported that she has woke up and baby's face was covered by the blankets he was swaddled in. MOB voiced concerns about baby being suffocated. CSW provided review of Sudden Infant Death Syndrome (SIDS) precautions. MOB reported that baby will sleep in his crib and she verbalized understanding   of (SIDS).  CSW assessed MOB for safety, MOB denied SI and HI.   CSW informed MOB about hospital drug policy and inquired about MOB's substance use during pregnancy. MOB reported that she smoked marijuana and was unable to recall last use, reporting maybe 2-3 weeks ago. MOB reported that she also drank alcohol, noting it's considered a substance. CSW inquired why MOB was drinking when she knew that she was pregnant, MOB reported because she can and it's legal. CSW explained to MOB that CSW was only inquiring because it can be harmful to the baby, MOB reported that she was going to have a glass of wine if she wanted one. CSW informed MOB that infant had a positive UDS for marijuana and that a CPS report would be made, MOB reported that she knew that.   CSW provided education regarding  the baby blues period vs. perinatal mood disorders, discussed treatment and gave resources for mental health follow up if concerns arise.  CSW recommends self-evaluation during the postpartum time period using the New Mom Checklist from Postpartum Progress and encouraged MOB to contact a medical professional if symptoms are noted at any time.    CSW identifies no further need for intervention and no barriers to discharge at this time.  CSW made a report to [place] CPS for positive UDS for THC. CPS will follow-up with family.   CSW Plan/Description:  Child Protective Service Report , CSW Will Continue to Monitor Umbilical Cord Tissue Drug Screen Results and Make Report if Warranted, Hospital Drug Screen Policy Information, Perinatal Mood and Anxiety Disorder (PMADs) Education, Sudden Infant Death Syndrome (SIDS) Education, No Further Intervention Required/No Barriers to Discharge    Yordi Krager L Caeden Foots, LCSW 10/25/2018, 11:41 AM  

## 2018-10-25 NOTE — Discharge Summary (Addendum)
Postpartum Discharge Summary     Patient Name: Monique Hoffman DOB: Jun 26, 1981 MRN: [id number]  Date of admission: 10/23/2018 Delivering Provider: Seabron Spates   Date of discharge: 10/25/2018  Admitting diagnosis: 40wks, ctx Intrauterine pregnancy: [redacted]w[redacted]d    Secondary diagnosis:  Active Problems:   Indication for care in labor or delivery   Postpartum care following vaginal delivery  Additional problems:  AMA Alcohol Use  Marijuana Use Tobacco Use     Discharge diagnosis: Term Pregnancy Delivered                                                                                                Post partum procedures:None  Augmentation: AROM and None  Complications: None  Hospital course:  Onset of Labor With Vaginal Delivery     37y.o. yo G1P1001 at 311w6das admitted in Active Labor on 10/23/2018. Patient had an uncomplicated labor course as follows:  Membrane Rupture Time/Date: 9:18 PM ,10/23/2018   Intrapartum Procedures: Episiotomy: None [1]                                         Lacerations:  2nd degree [3];Perineal [11]  Patient had a delivery of a Viable infant. 10/24/2018  Information for the patient's newborn:  BrMalini, Flemings0[[id number]]Delivery Method: Vag-Spont(Filed from delivery)    Pateint had an uncomplicated postpartum course.  She is ambulating, tolerating a regular diet, passing flatus, and urinating well. Patient is discharged home in stable condition on 10/25/18.   Magnesium Sulfate recieved: No BMZ received: No  Physical exam  Vitals:   10/24/18 0935 10/24/18 1340 10/24/18 1821 10/25/18 0559  BP: 110/71 121/82 117/77 106/72  Pulse: 83 (!) 107 (!) 107 81  Resp: _0 Temp: 98.6 F (37 C) 98.8 F (37.1 C) 98.1 F (36.7 C) 98.2 F (36.8 C)  TempSrc: Oral Oral Oral Oral  SpO2:  100%  97%  Weight:      Height:       General: alert, cooperative and no distress Lochia: appropriate Uterine Fundus: firm Incision:  N/A DVT Evaluation: No evidence of DVT seen on physical exam. Negative Homan's sign. No cords or calf tenderness. No significant calf/ankle edema. Labs: Lab Results  Component Value Date   WBC 17.4 (H) 10/24/2018   HGB 11.9 (L) 10/24/2018   HCT 35.8 (L) 10/24/2018   MCV 87.3 10/24/2018   PLT 267 10/24/2018   CMP Latest Ref Rng & Units 04/05/2017  Glucose 65 - 99 mg/dL 94  BUN 6 - 20 mg/dL 6  Creatinine 0.44 - 1.00 mg/dL 0.71  Sodium 135 - 145 mmol/L 138  Potassium 3.5 - 5.1 mmol/L 3.8  Chloride 101 - 111 mmol/L 107  CO2 22 - 32 mmol/L 22  Calcium 8.9 - 10.3 mg/dL 8.7(L)    Discharge instruction: per After Visit Summary and "Baby and Me Booklet".  After visit meds:  Allergies as of 10/25/2018   No Known Allergies  Medication List    TAKE these medications   ibuprofen 600 MG tablet Commonly known as:  ADVIL,MOTRIN Take 1 tablet (600 mg total) by mouth every 6 (six) hours.   PREPLUS 27-1 MG Tabs Take 1 tablet by mouth daily.   senna-docusate 8.6-50 MG tablet Commonly known as:  Senokot-S Take 2 tablets by mouth daily for 7 days. Start taking on:  10/26/2018   sodium chloride 0.65 % Soln nasal spray Commonly known as:  OCEAN Place 1 spray into both nostrils as needed for congestion.       Diet: routine diet  Activity: Advance as tolerated. Pelvic rest for 6 weeks.   Outpatient follow up:4 weeks Follow up Appt: Future Appointments  Date Time Provider [place]  11/22/2018 10:00 AM Woodroe Mode, MD [facility] None   Follow up Visit:   Please schedule this patient for Postpartum visit in: 4 weeks with the following provider: MD For C/S patients schedule nurse incision check in weeks 2 weeks: no High risk pregnancy complicated by: alcohol use disorder, tobacco use, incarceration Delivery mode:  SVD Anticipated Birth Control:  Plans Interval BTL PP Procedures needed: None  Schedule Integrated BH Visit: Yes  Newborn Data: Live born female  Birth  Weight: 5 lb 13.6 oz (2654 g) APGAR: 8, 9  Newborn Delivery   Birth date/time:  10/24/2018 00:54:00 Delivery type:  Vaginal, Spontaneous     Baby Feeding: Bottle Disposition:rooming in I personally saw and evaluated the patient, performing the key elements of the service. I developed and verified the management plan that is described in the resident's/student's note, and I agree with the content with my edits above. VSS, HRR&R, Resp unlabored, Legs neg.  Nigel Berthold, CNM 10/26/2018 9:49 PM     10/25/2018 Adline Potter, MD

## 2018-10-26 NOTE — Discharge Instructions (Signed)
Postpartum Tubal Ligation Postpartum tubal ligation (PPTL) is a procedure to close the fallopian tubes. This is done so that you cannot get pregnant. When the fallopian tubes are closed, the eggs that the ovaries release cannot enter the uterus, and sperm cannot reach the eggs. PPTL is done right after childbirth or 1-2 days after childbirth, before the uterus returns to its normal location. PPTL is sometimes called "getting your tubes tied." You should not have this procedure if you want to get pregnant someday or if you are unsure about having more children. Tell a health care provider about:  Any allergies you have.  All medicines you are taking, including vitamins, herbs, eye drops, creams, and over-the-counter medicines.  Previous problems you or members of your family have had with the use of anesthetics.  Any blood disorders you have.  Previous surgeries you have had.  Any medical conditions you may have.  Any past pregnancies. What are the risks? Generally, this is a safe procedure. However, problems may occur, including:  Infection.  Bleeding.  Injury to surrounding organs.  Side effects from anesthetics.  Failure of the procedure.  This procedure can increase your risk of a kind of pregnancy in which a fertilized egg attaches to the outside of the uterus (ectopic pregnancy). What happens before the procedure?  Ask your health care provider about: ? How much pain you can expect to have. ? What medicines you will be given for pain, especially if you are planning to breastfeed.  Follow instructions from your health care provider about eating and drinking restrictions. What happens during the procedure? If you had a vaginal delivery:  You may be given one or more of the following: ? A medicine that helps you relax (sedative). ? A medicine to numb the area (local anesthetic). ? A medicine to make you fall asleep (general anesthetic). ? A medicine that is injected  into an area of your body to numb everything below the injection site (regional anesthetic).  If you have been given a general anesthetic, a tube will be put down your throat to help you breathe.  An IV tube will be inserted into one of your veins to give you medicines and fluids during the procedure.  Your bladder may be emptied with a small tube (catheter).  An incision will be made just below your belly button.  Your fallopian tubes will be located and brought up through the incision.  Your fallopian tubes will be tied off, burned (cauterized), or blocked with a clip, ring, or clamp. A small portion in the center of each fallopian tube may be removed.  The incision will be closed with stitches (sutures).  A bandage (dressing) will be placed over the incision.  If you had a cesarean delivery:  Tubal ligation will be done through the incision that was used for the cesarean delivery of your baby.  The incision will be closed with sutures.  A dressing will be placed over the incision.  The procedure may vary among health care providers and hospitals. What happens after the procedure?  Your blood pressure, heart rate, breathing rate, and blood oxygen level will be monitored often until the medicines you were given have worn off.  You will be given pain medicine as needed.  Do not drive for 24 hours if you received a sedative. This information is not intended to replace advice given to you by your health care provider. Make sure you discuss any questions you have with your health  care provider. Document Released: 11/06/2005 Document Revised: 04/10/2016 Document Reviewed: 10/17/2015 Elsevier Interactive Patient Education  2018 [employer] Vaginal Delivery, Care After Refer to this sheet in the next few weeks. These instructions provide you with information about caring for yourself after vaginal delivery. Your health care provider may also give you more specific instructions.  Your treatment has been planned according to current medical practices, but problems sometimes occur. Call your health care provider if you have any problems or questions. What can I expect after the procedure? After vaginal delivery, it is common to have:  Some bleeding from your vagina.  Soreness in your abdomen, your vagina, and the area of skin between your vaginal opening and your anus (perineum).  Pelvic cramps.  Fatigue.  Follow these instructions at home: Medicines  Take over-the-counter and prescription medicines only as told by your health care provider.  If you were prescribed an antibiotic medicine, take it as told by your health care provider. Do not stop taking the antibiotic until it is finished. Driving   Do not drive or operate heavy machinery while taking prescription pain medicine.  Do not drive for 24 hours if you received a sedative. Lifestyle  Do not drink alcohol. This is especially important if you are breastfeeding or taking medicine to relieve pain.  Do not use tobacco products, including cigarettes, chewing tobacco, or e-cigarettes. If you need help quitting, ask your health care provider. Eating and drinking  Drink at least 8 eight-ounce glasses of water every day unless you are told not to by your health care provider. If you choose to breastfeed your baby, you may need to drink more water than this.  Eat high-fiber foods every day. These foods may help prevent or relieve constipation. High-fiber foods include: ? Whole grain cereals and breads. ? Brown rice. ? Beans. ? Fresh fruits and vegetables. Activity  Return to your normal activities as told by your health care provider. Ask your health care provider what activities are safe for you.  Rest as much as possible. Try to rest or take a nap when your baby is sleeping.  Do not lift anything that is heavier than your baby or 10 lb (4.5 kg) until your health care provider says that it is  safe.  Talk with your health care provider about when you can engage in sexual activity. This may depend on your: ? Risk of infection. ? Rate of healing. ? Comfort and desire to engage in sexual activity. Vaginal Care  If you have an episiotomy or a vaginal tear, check the area every day for signs of infection. Check for: ? More redness, swelling, or pain. ? More fluid or blood. ? Warmth. ? Pus or a bad smell.  Do not use tampons or douches until your health care provider says this is safe.  Watch for any blood clots that may pass from your vagina. These may look like clumps of dark red, brown, or black discharge. General instructions  Keep your perineum clean and dry as told by your health care provider.  Wear loose, comfortable clothing.  Wipe from front to back when you use the toilet.  Ask your health care provider if you can shower or take a bath. If you had an episiotomy or a perineal tear during labor and delivery, your health care provider may tell you not to take baths for a certain length of time.  Wear a bra that supports your breasts and fits you well.  If possible,  have someone help you with household activities and help care for your baby for at least a few days after you leave the hospital.  Keep all follow-up visits for you and your baby as told by your health care provider. This is important. Contact a health care provider if:  You have: ? Vaginal discharge that has a bad smell. ? Difficulty urinating. ? Pain when urinating. ? A sudden increase or decrease in the frequency of your bowel movements. ? More redness, swelling, or pain around your episiotomy or vaginal tear. ? More fluid or blood coming from your episiotomy or vaginal tear. ? Pus or a bad smell coming from your episiotomy or vaginal tear. ? A fever. ? A rash. ? Little or no interest in activities you used to enjoy. ? Questions about caring for yourself or your baby.  Your episiotomy or  vaginal tear feels warm to the touch.  Your episiotomy or vaginal tear is separating or does not appear to be healing.  Your breasts are painful, hard, or turn red.  You feel unusually sad or worried.  You feel nauseous or you vomit.  You pass large blood clots from your vagina. If you pass a blood clot from your vagina, save it to show to your health care provider. Do not flush blood clots down the toilet without having your health care provider look at them.  You urinate more than usual.  You are dizzy or light-headed.  You have not breastfed at all and you have not had a menstrual period for 12 weeks after delivery.  You have stopped breastfeeding and you have not had a menstrual period for 12 weeks after you stopped breastfeeding. Get help right away if:  You have: ? Pain that does not go away or does not get better with medicine. ? Chest pain. ? Difficulty breathing. ? Blurred vision or spots in your vision. ? Thoughts about hurting yourself or your baby.  You develop pain in your abdomen or in one of your legs.  You develop a severe headache.  You faint.  You bleed from your vagina so much that you fill two sanitary pads in one hour. This information is not intended to replace advice given to you by your health care provider. Make sure you discuss any questions you have with your health care provider. Document Released: 11/03/2000 Document Revised: 04/19/2016 Document Reviewed: 11/21/2015 Elsevier Interactive Patient Education  2018 [employer]

## 2018-10-26 NOTE — Discharge Summary (Addendum)
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 yo G1P1001 at 7838w6d was admitted in Latent Labor on 10/23/2018.  Patient had an uncomplicated labor course as follows:  Membrane Rupture Time/Date: 9:18 PM ,10/23/2018   Intrapartum Procedures: Episiotomy: None [1]                                         Lacerations:  2nd degree [3];Perineal [11]  Patient had a delivery of a Viable infant. 10/24/2018  Information for the patient's newborn:  Olena LeatherwoodBrewington, Boy Sherly [[id number]][[id number]]  Delivery Method: Vag-Spont(Filed from delivery)    Pateint had an uncomplicated postpartum course. On PPD#0 she signed her 30-day tubal papers. She had a SW consult and a CPS report has been made regarding her social history. She is ambulating, tolerating a regular diet, passing flatus, and urinating well. She has not shown s/s of withdrawal from daily alcohol use. Patient is discharged home in stable condition on 10/26/18.   Physical exam  Vitals:   10/24/18 1821 10/25/18 0559 10/25/18 1413 10/25/18 2200  BP: 117/77 106/72 129/81 123/79  Pulse: (!) 107 81 83 95   Resp: 18  18 18   Temp: 98.1 F (36.7 C) 98.2 F (36.8 C) 98 F (36.7 C) 98.5 F (36.9 C)  TempSrc: Oral Oral  Oral  SpO2:  97%    Weight:      Height:       General: alert Lochia: appropriate Uterine Fundus: firm Incision: N/A DVT Evaluation: No evidence of DVT seen on physical exam. Labs: Lab Results  Component Value Date   WBC 17.4 (H) 10/24/2018   HGB 11.9 (L) 10/24/2018   HCT 35.8 (L) 10/24/2018   MCV 87.3 10/24/2018   PLT 267 10/24/2018   CMP Latest Ref Rng & Units 04/05/2017  Glucose 65 - 99 mg/dL 94  BUN 6 - 20 mg/dL 6  Creatinine 0.860.44 - 5.781.00 mg/dL 4.690.71  Sodium 629135 - 528145 mmol/L 138  Potassium 3.5 - 5.1 mmol/L 3.8  Chloride 101 - 111 mmol/L 107  CO2 22 - 32 mmol/L 22  Calcium 8.9 - 10.3 mg/dL 9.1(Y)    Discharge instruction: per After Visit Summary and "Baby and Me Booklet".  After visit meds:  Allergies as of 10/26/2018   No Known Allergies     Medication List    TAKE these medications   ibuprofen 600 MG tablet Commonly known as:  ADVIL,MOTRIN Take 1 tablet (600 mg total) by mouth every 6 (six) hours.   PREPLUS 27-1 MG Tabs Take 1 tablet by mouth daily.   senna-docusate 8.6-50 MG tablet Commonly known as:  Senokot-S Take 2 tablets by mouth daily for 7 days.   sodium chloride 0.65 % Soln nasal spray Commonly known as:  OCEAN Place 1 spray into both nostrils as needed for congestion.       Diet: routine diet  Activity: Advance as tolerated. Pelvic rest for 6 weeks.   Outpatient follow up:2 weeks preop BTL visit with MD, then BTL for 4-6wks Follow up Appt: Future Appointments  Date Time Provider Department Center  11/22/2018 10:00 AM Adam Phenix, MD [facility] None   Follow up Visit:No follow-ups on file.  Postpartum contraception: Tubal Ligation- interval  Newborn Data: Live born female  Birth Weight: 5 lb 13.6 oz (2654 g) APGAR: 8, 9  Newborn Delivery   Birth date/time:  10/24/2018 00:54:00 Delivery type:  Vaginal, Spontaneous      Baby Feeding: Bottle Disposition:CPS report has been made, but no barriers to d/c   10/26/2018 Arabella Merles, CNM  7:08 AM

## 2018-11-22 ENCOUNTER — Encounter: Payer: Self-pay | Admitting: Obstetrics & Gynecology

## 2018-11-22 ENCOUNTER — Ambulatory Visit (INDEPENDENT_AMBULATORY_CARE_PROVIDER_SITE_OTHER): Payer: Medicaid Other | Admitting: Obstetrics & Gynecology

## 2018-11-22 ENCOUNTER — Encounter (HOSPITAL_COMMUNITY): Payer: Self-pay

## 2018-11-22 DIAGNOSIS — Z1389 Encounter for screening for other disorder: Secondary | ICD-10-CM | POA: Diagnosis not present

## 2018-11-22 NOTE — Patient Instructions (Signed)
Laparoscopic Tubal Ligation Laparoscopic tubal ligation is a procedure to close the fallopian tubes. This is done so that you cannot get pregnant. When the fallopian tubes are closed, the eggs that your ovaries release cannot enter the uterus, and sperm cannot reach the released eggs. A laparoscopic tubal ligation is sometimes called "getting your tubes tied." You should not have this procedure if you want to get pregnant someday or if you are unsure about having more children. Tell a health care provider about:  Any allergies you have.  All medicines you are taking, including vitamins, herbs, eye drops, creams, and over-the-counter medicines.  Any problems you or family members have had with anesthetic medicines.  Any blood disorders you have.  Any surgeries you have had.  Any medical conditions you have.  Whether you are pregnant or may be pregnant.  Any past pregnancies. What are the risks? Generally, this is a safe procedure. However, problems may occur, including:  Infection.  Bleeding.  Injury to surrounding organs.  Side effects from anesthetics.  Failure of the procedure. This procedure can increase your risk of a kind of pregnancy in which a fertilized egg attaches to the outside of the uterus (ectopic pregnancy). What happens before the procedure?  Ask your health care provider about: ? Changing or stopping your regular medicines. This is especially important if you are taking diabetes medicines or blood thinners. ? Taking medicines such as aspirin and ibuprofen. These medicines can thin your blood. Do not take these medicines before your procedure if your health care provider instructs you not to.  Follow instructions from your health care provider about eating and drinking restrictions.  Plan to have someone take you home after the procedure.  If you go home right after the procedure, plan to have someone with you for 24 hours. What happens during the  procedure?      You will be given one or more of the following: ? A medicine to help you relax (sedative). ? A medicine to numb the area (local anesthetic). ? A medicine to make you fall asleep (general anesthetic). ? A medicine that is injected into an area of your body to numb everything below the injection site (regional anesthetic).  An IV tube will be inserted into one of your veins. It will be used to give you medicines and fluids during the procedure.  Your bladder may be emptied with a small tube (catheter).  If you have been given a general anesthetic, a tube will be put down your throat to help you breathe.  Two small cuts (incisions) will be made in your lower abdomen and near your belly button.  Your abdomen will be inflated with a gas. This will let the surgeon see better and will give the surgeon room to work.  A thin, lighted tube (laparoscope) with a camera attached will be inserted into your abdomen through one of the incisions. Small instruments will be inserted through the other incision.  The fallopian tubes will be tied off, burned (cauterized), or blocked with a clip, ring, or clamp. A small portion in the center of each fallopian tube may be removed.  The gas will be released from the abdomen.  The incisions will be closed with stitches (sutures).  A bandage (dressing) will be placed over the incisions. The procedure may vary among health care providers and hospitals. What happens after the procedure?  Your blood pressure, heart rate, breathing rate, and blood oxygen level will be monitored often until   the medicines you were given have worn off.  You will be given medicine to help with pain, nausea, and vomiting as needed. This information is not intended to replace advice given to you by your health care provider. Make sure you discuss any questions you have with your health care provider. Document Released: 02/12/2001 Document Revised: 07/03/2017 Document  Reviewed: 10/17/2015 Elsevier Interactive Patient Education  2019 Elsevier Inc.  

## 2018-11-22 NOTE — Progress Notes (Signed)
Post Partum Exam  Monique Hoffman is a 38 y.o. G84P1001 female who presents for a postpartum visit. She is 4 weeks postpartum following a spontaneous vaginal delivery. I have fully reviewed the prenatal and intrapartum course. The delivery was at 39 gestational weeks.  Anesthesia: epidural. Postpartum course has been well. Baby's course has been well. Baby is feeding by bottle - Lucien Mons Start . Bleeding staining only. Bowel function is normal. Bladder function is normal. Patient is not sexually active. Contraception method is none. Postpartum depression screening:neg EPDS= 0  The following portions of the patient's history were reviewed and updated as appropriate: allergies, current medications, past family history, past medical history, past social history, past surgical history and problem list. Last pap smear done 04/01/2018 and was Normal  Review of Systems Pertinent items are noted in HPI.    Objective:  Blood pressure 119/81, pulse 89, weight 120 lb (54.4 kg), not currently breastfeeding.  General:  alert, cooperative and no distress   Breasts:    Lungs:    Heart:  regular rate and rhythm, S1, S2 normal, no murmur, click, rub or gallop  Abdomen: soft, non-tender; bowel sounds normal; no masses,  no organomegaly   Vulva:  not evaluated  Vagina: not evaluated                    Assessment:    normal postpartum exam. Pap smear not done at today's visit.   Plan:   1. Contraception: tubal ligation and requests as OP 2. Schedule LBTL.39 y.o. G1P1001 with undesired fertility,status post vaginal delivery, desires permanent sterilization. Risks and benefits of procedure discussed with patient including permanence of method, bleeding, infection, injury to surrounding organs and need for additional procedures. Risk failure of 0.5-1% with increased risk of ectopic gestation if pregnancy occurs was also discussed with patient. Patient verbalized understanding and all questions were  answered.   3. Follow up as needed. Abstinence until procedure  Adam Phenix, MD 11/22/2018

## 2018-11-26 ENCOUNTER — Other Ambulatory Visit: Payer: Self-pay | Admitting: Obstetrics & Gynecology

## 2018-11-26 NOTE — Progress Notes (Signed)
Orders for surgery 

## 2018-12-25 ENCOUNTER — Ambulatory Visit (HOSPITAL_BASED_OUTPATIENT_CLINIC_OR_DEPARTMENT_OTHER): Admit: 2018-12-25 | Payer: Medicaid Other | Admitting: Obstetrics & Gynecology

## 2018-12-25 ENCOUNTER — Encounter (HOSPITAL_BASED_OUTPATIENT_CLINIC_OR_DEPARTMENT_OTHER): Payer: Self-pay

## 2018-12-25 SURGERY — LIGATION, FALLOPIAN TUBE, LAPAROSCOPIC
Anesthesia: Choice | Laterality: Bilateral
